# Patient Record
Sex: Female | Born: 1975 | Race: White | Hispanic: No | Marital: Married | Smoking: Current every day smoker
Health system: Southern US, Community
[De-identification: ages and names within clinical notes are randomized; demographics above are authoritative.]

## PROBLEM LIST (undated history)

## (undated) DIAGNOSIS — N200 Calculus of kidney: Secondary | ICD-10-CM

## (undated) DIAGNOSIS — G4700 Insomnia, unspecified: Secondary | ICD-10-CM

## (undated) DIAGNOSIS — N809 Endometriosis, unspecified: Secondary | ICD-10-CM

## (undated) HISTORY — PX: ABDOMINAL HYSTERECTOMY: SHX81

## (undated) HISTORY — PX: ABLATION COLPOCLESIS: SHX1118

## (undated) HISTORY — PX: ABDOMINAL EXPLORATION SURGERY: SHX538

## (undated) HISTORY — PX: TUBAL LIGATION: SHX77

---

## 1997-11-05 ENCOUNTER — Inpatient Hospital Stay (HOSPITAL_COMMUNITY): Admission: AD | Admit: 1997-11-05 | Discharge: 1997-11-05 | Payer: Self-pay | Admitting: *Deleted

## 1997-12-04 ENCOUNTER — Inpatient Hospital Stay (HOSPITAL_COMMUNITY): Admission: AD | Admit: 1997-12-04 | Discharge: 1997-12-06 | Payer: Self-pay | Admitting: Obstetrics and Gynecology

## 2001-11-15 ENCOUNTER — Other Ambulatory Visit: Admission: RE | Admit: 2001-11-15 | Discharge: 2001-11-15 | Payer: Self-pay | Admitting: Obstetrics and Gynecology

## 2002-02-08 ENCOUNTER — Ambulatory Visit (HOSPITAL_COMMUNITY): Admission: RE | Admit: 2002-02-08 | Discharge: 2002-02-08 | Payer: Self-pay | Admitting: Obstetrics and Gynecology

## 2003-06-05 ENCOUNTER — Other Ambulatory Visit: Admission: RE | Admit: 2003-06-05 | Discharge: 2003-06-05 | Payer: Self-pay | Admitting: Obstetrics and Gynecology

## 2004-11-08 ENCOUNTER — Ambulatory Visit: Payer: Self-pay | Admitting: Internal Medicine

## 2004-11-23 ENCOUNTER — Ambulatory Visit: Payer: Self-pay | Admitting: Internal Medicine

## 2004-11-26 ENCOUNTER — Emergency Department (HOSPITAL_COMMUNITY): Admission: EM | Admit: 2004-11-26 | Discharge: 2004-11-26 | Payer: Self-pay | Admitting: Emergency Medicine

## 2004-12-29 ENCOUNTER — Encounter (INDEPENDENT_AMBULATORY_CARE_PROVIDER_SITE_OTHER): Payer: Self-pay | Admitting: Specialist

## 2004-12-29 ENCOUNTER — Ambulatory Visit (HOSPITAL_COMMUNITY): Admission: RE | Admit: 2004-12-29 | Discharge: 2004-12-29 | Payer: Self-pay | Admitting: Obstetrics and Gynecology

## 2006-10-16 ENCOUNTER — Encounter: Admission: RE | Admit: 2006-10-16 | Discharge: 2006-10-16 | Payer: Self-pay | Admitting: Orthopedic Surgery

## 2007-10-02 ENCOUNTER — Encounter: Admission: RE | Admit: 2007-10-02 | Discharge: 2007-10-02 | Payer: Self-pay | Admitting: Internal Medicine

## 2010-03-12 ENCOUNTER — Encounter
Admission: RE | Admit: 2010-03-12 | Discharge: 2010-03-12 | Payer: Self-pay | Admitting: Physical Medicine and Rehabilitation

## 2010-10-23 ENCOUNTER — Encounter: Payer: Self-pay | Admitting: Internal Medicine

## 2010-10-24 ENCOUNTER — Encounter: Payer: Self-pay | Admitting: General Surgery

## 2011-02-18 NOTE — Op Note (Signed)
[facility]  Patient:    [name], [name] Visit Number: [id number] MRN: [id number]          Service Type: DSU Location: [facility] Attending Physician:  Lenoard Aden Dictated by:   Lenoard Aden, M.D. Proc. Date: 02/08/02 Admit Date:  02/08/2002 Discharge Date: 02/08/2002   CC:         [facility]   Operative Report  PREOPERATIVE DIAGNOSES:       Desire for elective sterilization.  POSTOPERATIVE DIAGNOSES:      Desire for elective sterilization.  PROCEDURE:                    Laparoscopic tubal sterilization.  SURGEON:                      Lenoard Aden, M.D.  ANESTHESIA:                   General.  ESTIMATED BLOOD LOSS:         Less than 50 cc.  DRAINS:                       None.  COUNTS:                       Correct.  DISPOSITION:                  Patient to recovery in good condition.  BRIEF OPERATIVE NOTE:         After being apprised of the risks of anesthesia, infection, bleeding, injury to abdominal organs with need for repair, patient was brought to the operating room where she was administered general anesthetic without complications, prepped and draped in the usual sterile fashion, catheterized until the bladder was empty.  After achieving adequate anesthesia examination under anesthesia reveals a small mid positioned uterus and no adnexal masses.  Hulka tenaculum placed per vagina.  Infraumbilical incision then made with a scalpel after placement of a dilute Marcaine solution.  Veress needle placed.  Opening pressure -1 noted.  Hanging drop test is negative revealing negative intraperitoneal pressure.  CO2 4 L insufflated without difficulty setting patient pressure to 25.  Trocar placed atraumatically.  Pictures taken.  CO2 released down to a pressure of 15. Normal liver, gallbladder bed, normal appendix, normal tubes and ovaries, normal uterus, normal anterior/posterior cul-de-sac are all noted.  At this time  Kleppinger bipolar cautery entered through the operative port and cauterization using bipolar cautery down to a resistance of 0 was done on each tube after being traced out to the fimbriated end cauterizing three contiguous areas down to a resistance of 0.  Tubes were divided using scissors bilaterally and tubal lumens are visualized.  CO2 is released.  Visualization reveals no bleeding.  Instruments are removed from the patients abdomen under direct visualization.  Patients incision is closed using 0 Vicryl and Dermabond.  Instruments removed from the vagina.  Patient is awakened and transferred to recovery in good condition. Dictated by:   Lenoard Aden, M.D. Attending Physician:  Lenoard Aden DD:  02/08/02 TD:  02/11/02 Job: [id number]

## 2011-02-18 NOTE — Op Note (Signed)
[name], [name]                ACCOUNT NO.:  [id number]   MEDICAL RECORD NO.:  [id number]          PATIENT TYPE:  AMB   LOCATION:  SDC                           FACILITY:  [facility]   PHYSICIAN:  Lenoard Aden, M.D.DATE OF BIRTH:  1976/01/01   DATE OF PROCEDURE:  12/29/2004  DATE OF DISCHARGE:                                 OPERATIVE REPORT   PREOPERATIVE DIAGNOSES:  1.  Menorrhagia.  2.  Pelvic pain.   POSTOPERATIVE DIAGNOSES:  1.  Menorrhagia.  2.  Pelvic pain.  3.  Pelvic endometriosis.   PROCEDURE:  Diagnostic hysteroscopy, dilatation and curettage, NovaSure  endometrial ablation, diagnostic laparoscopy, ablation of endometriosis.   SURGEON:  Lenoard Aden, M.D.   ANESTHESIA:  General.   ESTIMATED BLOOD LOSS:  Less than 50 mL.   COMPLICATIONS:  None.   DRAINS:  None.   COUNTS:  Correct.   Patient to recovery in good condition.   BRIEF OPERATIVE NOTE:  After being apprised of the risks of anesthesia,  infection, bleeding, inability to cure pelvic pain, injury to abdominal  organs with possible need for repair, uterine perforation with possible need  for repair, delayed versus immediate complications to include bowel and  bladder injury with need for repair, the patient was brought to the  operating room, where she was administered a general anesthetic without  complications.  She was prepped and draped in the usual sterile fashion.  Her feet are placed in the [place] stirrups.  At this time exam under  anesthesia reveals an anteflexed uterus and no adnexal masses.  A Foley  catheter placed.  The patient was prepped and draped as noted in the usual  sterile fashion and the uterus sounds to 7 cm and an intracervical length of  3 cm is noted for a total length of 4 cm.  The cervix is dilated up to a #25  Pratt dilator, hysteroscope placed.  Visualization reveals thickened  endometrium but no obvious structural masses.  D&C performed.  Revisualization  reveals a normal endometrial cavity without evidence of  endometrial masses, normal tubal ostia bilaterally.  NovaSure endometrial  ablation was accomplished.  Intracavitary width set at 2.8 cm.  CO2 test is  normal and ablation is accomplished without difficulty, taking approximately  96 seconds total.  The instrument is removed.  Revisualization  hysteroscopically reveals a well-ablated cavity and no evidence of  perforation.  At this time instruments were removed, Hulka tenaculum placed.  During the hysteroscopic procedure prior to the laparoscopic procedure, a  dilute lidocaine/epinephrine solution 20 mL was placed in a standard  paracervical block.  Infraumbilical incision was then made with a scalpel,  Veress needle placed.  Opening pressure of -1 noted.  Three liters CO2  insufflated.  Both the trocar and laparoscope placed.  Atraumatic trocar  entry is noted.  Normal liver, gallbladder bed.  Appendix is adherent to the  posterior wall of the peritoneum but does not appear inflamed.  At this time  the pelvis is visualized.  There is nothing in the anterior cul-de-sac.  Both tubes are previously divided.  Bilateral normal ovaries are noted.  There is evidence of three peritoneal windows in the posterior cul-de-sac  and an implant of powdery blue endometrial implant consistent with  endometriosis.  Kleppinger bipolar cautery is entered through a 5 mm trocar  site which is placed atraumatically and suprapubically.  This area is  cauterized.  The peritoneal windows are inverted and cauterized using  Kleppinger bipolar cautery.  Good hemostasis is noted.  Ureters are noted to  be peristalsing bilaterally.  At this time no other endometriosis is  identified.  The instruments are removed under direct visualization and the  CO2 is released.  The infraumbilical incision is closed using a 0 Vicryl and  Dermabond, Dermabond placed in the lower incision, dilute Marcaine solution  10 mL total  placed.  All instruments are removed from the vagina.  The  patient is awakened and transferred to recovery in good condition.      RJT/MEDQ  D:  12/29/2004  T:  12/29/2004  Job:  [id number]

## 2011-02-18 NOTE — H&P (Signed)
[facility]  Patient:    HARMONIE, VERRASTRO Visit Number: [id number] MRN: [id number]          Service Type: DSU Location: [facility] Attending Physician:  Lenoard Aden Dictated by:   Lenoard Aden, M.D. Admit Date:  02/08/2002   CC:         [facility]   History and Physical  CHIEF COMPLAINT:              Desire for elective sterilization.  HISTORY OF PRESENT ILLNESS:   The patient is a 35 year old white female, G2, P1, with history of situational depression and desire for elective sterilization who presents for tubal ligation.  PAST MEDICAL HISTORY:         Remarkable for uncomplicated vaginal delivery and one uncomplicated abortion.   Has had no other medical or surgical hospitalizations.  MEDICATIONS:                  Zoloft.  ALLERGIES:                    No known drug allergies.  FAMILY HISTORY:               Remarkable for hypertension, diabetes, colon cancer.  PHYSICAL EXAMINATION:  GENERAL:                      She is a well-developed, well-nourished white female in no apparent distress.  HEENT:                        Normal.  LUNGS:                        Clear.  HEART:                        Regular rate and rhythm.  ABDOMEN:                      Soft and nontender.  PELVIC:                       Uterus mid position.  No adnexal masses.  EXTREMITIES:                  No cords.  NEUROLOGIC:                   Nonfocal.  LABORATORY DATA:              Beta HCG negative.  IMPRESSION:                   1. Desire for elective sterilization.                               2. Situational depression.  PLAN:                         Proceed with laparoscopic tubal sterilization. Risks of anesthesia, infection, bleeding, injury to abdominal organs with need for repair discussed.  The patient acknowledges and desires to proceed. Delayed versus immediate complications to include bowel or bladder injury noted.  Failure risk of  tubal ligation 5 to 10:1000 noted.  Permanence of procedure discussed.  The patient has explored all other contraceptive issues  and wishes to proceed as previously discussed. Dictated by:   Lenoard Aden, M.D. Attending Physician:  Lenoard Aden DD:  02/08/02 TD:  02/08/02 Job: [id number]

## 2011-05-04 DIAGNOSIS — N200 Calculus of kidney: Secondary | ICD-10-CM

## 2011-05-04 HISTORY — DX: Calculus of kidney: N20.0

## 2011-05-05 ENCOUNTER — Emergency Department (INDEPENDENT_AMBULATORY_CARE_PROVIDER_SITE_OTHER): Payer: 59

## 2011-05-05 ENCOUNTER — Encounter: Payer: Self-pay | Admitting: *Deleted

## 2011-05-05 ENCOUNTER — Emergency Department (HOSPITAL_BASED_OUTPATIENT_CLINIC_OR_DEPARTMENT_OTHER)
Admission: EM | Admit: 2011-05-05 | Discharge: 2011-05-05 | Disposition: A | Discharge status: Home / Self Care | Payer: 59 | Attending: Emergency Medicine | Admitting: Emergency Medicine

## 2011-05-05 DIAGNOSIS — N200 Calculus of kidney: Secondary | ICD-10-CM

## 2011-05-05 DIAGNOSIS — F17200 Nicotine dependence, unspecified, uncomplicated: Secondary | ICD-10-CM | POA: Insufficient documentation

## 2011-05-05 DIAGNOSIS — R1011 Right upper quadrant pain: Secondary | ICD-10-CM | POA: Insufficient documentation

## 2011-05-05 LAB — URINALYSIS, ROUTINE W REFLEX MICROSCOPIC
Bilirubin Urine: NEGATIVE
Glucose, UA: NEGATIVE mg/dL
Ketones, ur: NEGATIVE mg/dL
Leukocytes, UA: NEGATIVE
Protein, ur: NEGATIVE mg/dL
pH: 5.5 (ref 5.0–8.0)

## 2011-05-05 LAB — URINE MICROSCOPIC-ADD ON

## 2011-05-05 MED ORDER — HYDROMORPHONE HCL 1 MG/ML IJ SOLN
0.5000 mg | INTRAMUSCULAR | Status: DC | PRN
  Administered 2011-05-05: 0.5 mg via INTRAVENOUS
  Filled 2011-05-05: qty 1

## 2011-05-05 MED ORDER — KETOROLAC TROMETHAMINE 30 MG/ML IJ SOLN
30.0000 mg | Freq: Once | INTRAMUSCULAR | Status: AC
  Administered 2011-05-05: 30 mg via INTRAVENOUS
  Filled 2011-05-05: qty 1

## 2011-05-05 MED ORDER — TAMSULOSIN HCL 0.4 MG PO CAPS: 0.4000 mg | ORAL_CAPSULE | Freq: Every day | ORAL | Status: DC

## 2011-05-05 MED ORDER — ONDANSETRON HCL 4 MG/2ML IJ SOLN
4.0000 mg | Freq: Three times a day (TID) | INTRAMUSCULAR | Status: DC | PRN
Start: 2011-05-05 — End: 2011-05-05
  Administered 2011-05-05: 4 mg via INTRAVENOUS
  Filled 2011-05-05: qty 2

## 2011-05-05 MED ORDER — OXYCODONE-ACETAMINOPHEN 5-325 MG PO TABS: 2.0000 | ORAL_TABLET | ORAL | Status: AC | PRN

## 2011-05-05 NOTE — ED Provider Notes (Signed)
History     CSN: [id number] Arrival date & time: 05/05/2011  1:40 PM  Chief Complaint  Patient presents with  . Abdominal Pain   Patient is a 35 y.o. female presenting with abdominal pain. The history is provided by the patient.  Abdominal Pain The primary symptoms of the illness include abdominal pain and nausea. The current episode started 1 to 2 hours ago. The onset of the illness was sudden. The problem has been gradually improving.  The abdominal pain began less than 1 hour ago. The pain came on suddenly. The abdominal pain is located in the RUQ and RLQ. The abdominal pain radiates to the RUQ and RLQ. The abdominal pain is relieved by nothing. The abdominal pain is exacerbated by deep breathing and certain positions.  The patient states that she believes she is currently not pregnant. The patient has not had a change in bowel habit. Additional symptoms associated with the illness include hematuria, frequency and back pain. Symptoms associated with the illness do not include chills or constipation. Significant associated medical issues do not include inflammatory bowel disease.    History reviewed. No pertinent past medical history.  Past Surgical History  Procedure Date  . Ablation colpoclesis   . Tubal ligation     No family history on file.  History  Substance Use Topics  . Smoking status: Current Everyday Smoker -- 0.5 packs/day  . Smokeless tobacco: Not on file  . Alcohol Use: Yes     occasionally    OB History    Grav Para Term Preterm Abortions TAB SAB Ect Mult Living                  Review of Systems  Constitutional: Negative for chills.  Gastrointestinal: Positive for nausea and abdominal pain. Negative for constipation.  Genitourinary: Positive for frequency and hematuria.  Musculoskeletal: Positive for back pain.  All other systems reviewed and are negative.    Physical Exam  BP 135/88  Pulse 97  Temp 97.9 F (36.6 C)  Resp 22  SpO2 100%  LMP  12/01/2004  Physical Exam  Constitutional: She appears well-developed and well-nourished.  Eyes: Pupils are equal, round, and reactive to light.  Neck: Normal range of motion.  Cardiovascular: Normal rate.   Pulmonary/Chest: Effort normal.  Abdominal: Soft. She exhibits no mass. There is no tenderness. There is no rebound and no guarding.  Musculoskeletal: Normal range of motion.  Skin: Skin is warm and dry.  Psychiatric: She has a normal mood and affect.    ED Course  Procedures  MDM  Urine shows a trace of hemoglobin. There is a 2 mm calculus within the right UVJ.   Pt counseled on results,  Referred to Urology,  Pt given Rx for pain medication and flomax.       Langston Masker, [place] 05/05/11 1551  Langston Masker, [place] 05/05/11 [phone]

## 2011-05-05 NOTE — ED Provider Notes (Signed)
Medical screening examination/treatment/procedure(s) were performed by non-physician practitioner and as supervising physician I was immediately available for consultation/collaboration.  Celene Kras, MD 05/05/11 2220

## 2011-05-05 NOTE — ED Notes (Signed)
Sudden onset left lower quad pain.  

## 2011-05-09 ENCOUNTER — Encounter (HOSPITAL_BASED_OUTPATIENT_CLINIC_OR_DEPARTMENT_OTHER): Payer: Self-pay | Admitting: *Deleted

## 2011-05-09 ENCOUNTER — Emergency Department (HOSPITAL_BASED_OUTPATIENT_CLINIC_OR_DEPARTMENT_OTHER)
Admission: EM | Admit: 2011-05-09 | Discharge: 2011-05-10 | Disposition: A | Discharge status: Home / Self Care | Payer: 59 | Attending: Emergency Medicine | Admitting: Emergency Medicine

## 2011-05-09 DIAGNOSIS — Z79899 Other long term (current) drug therapy: Secondary | ICD-10-CM | POA: Insufficient documentation

## 2011-05-09 DIAGNOSIS — N83209 Unspecified ovarian cyst, unspecified side: Secondary | ICD-10-CM | POA: Insufficient documentation

## 2011-05-09 DIAGNOSIS — R109 Unspecified abdominal pain: Secondary | ICD-10-CM | POA: Insufficient documentation

## 2011-05-09 DIAGNOSIS — N200 Calculus of kidney: Secondary | ICD-10-CM | POA: Insufficient documentation

## 2011-05-09 LAB — DIFFERENTIAL
Eosinophils Absolute: 0.1 10*3/uL (ref 0.0–0.7)
Eosinophils Relative: 1 % (ref 0–5)
Lymphocytes Relative: 35 % (ref 12–46)
Lymphs Abs: 2.5 10*3/uL (ref 0.7–4.0)
Monocytes Relative: 7 % (ref 3–12)

## 2011-05-09 LAB — BASIC METABOLIC PANEL
BUN: 10 mg/dL (ref 6–23)
CO2: 24 mEq/L (ref 19–32)
Chloride: 100 mEq/L (ref 96–112)
Glucose, Bld: 89 mg/dL (ref 70–99)
Sodium: 137 mEq/L (ref 135–145)

## 2011-05-09 LAB — URINALYSIS, ROUTINE W REFLEX MICROSCOPIC
Glucose, UA: NEGATIVE mg/dL
Hgb urine dipstick: NEGATIVE
Ketones, ur: NEGATIVE mg/dL
Leukocytes, UA: NEGATIVE
Protein, ur: NEGATIVE mg/dL
Specific Gravity, Urine: 1.004 — ABNORMAL LOW (ref 1.005–1.030)
pH: 6 (ref 5.0–8.0)

## 2011-05-09 LAB — CBC
Hemoglobin: 13.4 g/dL (ref 12.0–15.0)
MCHC: 34.6 g/dL (ref 30.0–36.0)
Platelets: 217 10*3/uL (ref 150–400)
RDW: 12.1 % (ref 11.5–15.5)
WBC: 7.4 10*3/uL (ref 4.0–10.5)

## 2011-05-09 MED ORDER — HYDROMORPHONE HCL 1 MG/ML IJ SOLN
1.0000 mg | Freq: Once | INTRAMUSCULAR | Status: AC
  Administered 2011-05-09: 1 mg via INTRAVENOUS
  Filled 2011-05-09: qty 1

## 2011-05-09 MED ORDER — KETOROLAC TROMETHAMINE 30 MG/ML IJ SOLN
30.0000 mg | Freq: Once | INTRAMUSCULAR | Status: AC
  Administered 2011-05-09: 30 mg via INTRAVENOUS
  Filled 2011-05-09: qty 1

## 2011-05-09 MED ORDER — SODIUM CHLORIDE 0.9 % IV BOLUS (SEPSIS)
1000.0000 mL | Freq: Once | INTRAVENOUS | Status: AC
  Administered 2011-05-09: 1000 mL via INTRAVENOUS

## 2011-05-09 MED ORDER — ONDANSETRON HCL 4 MG/2ML IJ SOLN
4.0000 mg | Freq: Once | INTRAMUSCULAR | Status: AC
  Administered 2011-05-09: 4 mg via INTRAVENOUS
  Filled 2011-05-09: qty 2

## 2011-05-09 NOTE — ED Provider Notes (Signed)
History    Scribed for Forbes Cellar, MD, the patient was seen in room MH12/MH12. This chart was scribed by Clarita Crane. This patient's care was started at 8:57PM.  CSN: [id number] Arrival date & time: 05/09/2011  7:27 PM  Chief Complaint  Patient presents with  . Abdominal Pain   HPI  Patient is a 35 year old female c/o waxing and waning sharp left sided abdominal pain with associated dysuria and nausea onset 4 days ago but significantly worsened several hours ago while at work. Denies hematuria, fever, chills, vomiting, constipation and diarrhea, vaginal discharge, chest pain, SOB. States abdominal pain is aggravated with lying flat and relieved by nothing. Notes she experienced no relief from Ibuprofen. Patient reports she had a CT-Abdomen performed 4 days ago which revealed a right sided kidney stone. States pain is identical to that pain but more on left side. Patient also notes she was discharged home after evaluation in ED 4 days ago with prescription for Oxycodone but ran out of the medication 2 days ago. Patient reports surgical history of tubal ligation and ablation colpoclesis. Patient is a current everyday smoker, occasional drinker and denies drug abuse. She does have a remote h/o ovarian cyst. Denies blood in stool   PAST MEDICAL HISTORY:  History reviewed. No pertinent past medical history.  PAST SURGICAL HISTORY:  Past Surgical History  Procedure Date  . Ablation colpoclesis   . Tubal ligation     MEDICATIONS:  Previous Medications   ALPRAZOLAM (XANAX) 0.5 MG TABLET    Take 0.5 mg by mouth daily. Anxiety and panic attacks     DIPHENHYDRAMINE-ACETAMINOPHEN (TYLENOL PM) 25-500 MG TABS    Take 2 tablets by mouth at bedtime.    IBUPROFEN (ADVIL,MOTRIN) 200 MG TABLET    Take 400 mg by mouth as needed. pain   OXYCODONE-ACETAMINOPHEN (PERCOCET) 5-325 MG PER TABLET    Take 2 tablets by mouth every 4 (four) hours as needed for pain.   PEDIATRIC MULTIVIT-MINERALS-C (FLINTSTONES  GUMMIES PO)    Take 1 tablet by mouth daily.     TAMSULOSIN HCL (FLOMAX) 0.4 MG CAPS    Take 1 capsule (0.4 mg total) by mouth daily.     ALLERGIES:  Allergies as of 05/09/2011  . (No Known Allergies)     FAMILY HISTORY:  No family history on file.   SOCIAL HISTORY: History   Social History  . Marital Status: Married    Spouse Name: N/A    Number of Children: N/A  . Years of Education: N/A   Social History Main Topics  . Smoking status: Current Everyday Smoker -- 0.5 packs/day  . Smokeless tobacco: None  . Alcohol Use: Yes     occasionally  . Drug Use: No  . Sexually Active: No   Other Topics Concern  . None   Social History Narrative  . None     Review of Systems 10 Systems reviewed and are negative for acute change except as noted in the HPI.  Physical Exam  BP 148/85  Pulse 88  Temp(Src) 98.7 F (37.1 C) (Oral)  Resp 12  SpO2 100%  LMP 12/01/2004  Physical Exam  Nursing note and vitals reviewed. Constitutional: She is oriented to person, place, and time. She appears well-developed and well-nourished.       Uncomfortable appearing.   HENT:  Head: Normocephalic and atraumatic.  Eyes: Conjunctivae are normal. Pupils are equal, round, and reactive to light.  Neck: Neck supple.  Cardiovascular: Normal rate and  regular rhythm.  Exam reveals no gallop and no friction rub.   No murmur heard. Pulmonary/Chest: Effort normal and breath sounds normal. She has no wheezes. She has no rales.  Abdominal: Soft. Bowel sounds are normal. She exhibits no distension. There is no tenderness. There is no CVA tenderness.  Genitourinary: No vaginal discharge found.       No CMT, no r/l adnexal ttp  Musculoskeletal: Normal range of motion. She exhibits no edema.  Neurological: She is alert and oriented to person, place, and time. No sensory deficit.  Skin: Skin is warm and dry.  Psychiatric: She has a normal mood and affect. Her behavior is normal.    ED Course    Procedures  OTHER DATA REVIEWED: Nursing notes, vital signs, and past medical records reviewed. Previous medical records reviewed and considered  Ct Abdomen Pelvis Wo Contrast  05/05/2011  *RADIOLOGY REPORT*  Clinical Data: Set left lower quadrant flank pain.  CT ABDOMEN AND PELVIS WITHOUT CONTRAST  Technique:  Multidetector CT imaging of the abdomen and pelvis was performed following the standard protocol without intravenous contrast.  Comparison: CT abdomen 11/26/2004  Findings: Lung bases are clear.  No focal hepatic lesion was noncontrast exam.  The gallbladder, pancreas, spleen, adrenal glands normal.  There is a 1 mm calculus within the mid right kidney (image 32). No hydronephrosis.  No hydroureter.  There is a 2 mm calculus within the right vesicoureteral junction.  This is on the vesicular side of the junction may be completely within the bladder.  No evidence of left nephrolithiasis or ureterolithiasis.  The stomach, small bowel, appendix, and cecum are normal.  The colon rectosigmoid colon are normal. Uterus and ovaries are normal. No free fluid the pelvis.  IMPRESSION:  1.  Small calculus within the right vesicoureteral junction.  The stone is on the bladder side of the junction. 2.  Punctate calcification within the right kidney. 3.  Normal appendix.  Original Report Authenticated By: Genevive Bi, M.D.   DIAGNOSTIC STUDIES:   LABS / RADIOLOGY: Results for orders placed during the hospital encounter of 05/09/11  URINALYSIS, ROUTINE W REFLEX MICROSCOPIC      Component Value Range   Color, Urine YELLOW  YELLOW    Appearance CLEAR  CLEAR    Specific Gravity, Urine 1.004 (*) 1.005 - 1.030    pH 6.0  5.0 - 8.0    Glucose, UA NEGATIVE  NEGATIVE (mg/dL)   Hgb urine dipstick NEGATIVE  NEGATIVE    Bilirubin Urine NEGATIVE  NEGATIVE    Ketones, ur NEGATIVE  NEGATIVE (mg/dL)   Protein, ur NEGATIVE  NEGATIVE (mg/dL)   Urobilinogen, UA 0.2  0.0 - 1.0 (mg/dL)   Nitrite NEGATIVE  NEGATIVE     Leukocytes, UA NEGATIVE  NEGATIVE   CBC      Component Value Range   WBC 7.4  4.0 - 10.5 (K/uL)   RBC 4.09  3.87 - 5.11 (MIL/uL)   Hemoglobin 13.4  12.0 - 15.0 (g/dL)   HCT 40.9  81.1 - 91.4 (%)   MCV 94.6  78.0 - 100.0 (fL)   MCH 32.8  26.0 - 34.0 (pg)   MCHC 34.6  30.0 - 36.0 (g/dL)   RDW 78.2  95.6 - 21.3 (%)   Platelets 217  150 - 400 (K/uL)  DIFFERENTIAL      Component Value Range   Neutrophils Relative 57  43 - 77 (%)   Neutro Abs 4.2  1.7 - 7.7 (K/uL)   Lymphocytes  Relative 35  12 - 46 (%)   Lymphs Abs 2.5  0.7 - 4.0 (K/uL)   Monocytes Relative 7  3 - 12 (%)   Monocytes Absolute 0.5  0.1 - 1.0 (K/uL)   Eosinophils Relative 1  0 - 5 (%)   Eosinophils Absolute 0.1  0.0 - 0.7 (K/uL)   Basophils Relative 0  0 - 1 (%)   Basophils Absolute 0.0  0.0 - 0.1 (K/uL)  BASIC METABOLIC PANEL      Component Value Range   Sodium 137  135 - 145 (mEq/L)   Potassium 3.9  3.5 - 5.1 (mEq/L)   Chloride 100  96 - 112 (mEq/L)   CO2 24  19 - 32 (mEq/L)   Glucose, Bld 89  70 - 99 (mg/dL)   BUN 10  6 - 23 (mg/dL)   Creatinine, Ser 1.61  0.50 - 1.10 (mg/dL)   Calcium 09.6  8.4 - 10.5 (mg/dL)   GFR calc non Af Amer >60  >60 (mL/min)   GFR calc Af Amer >60  >60 (mL/min)    PROCEDURES:  ED COURSE / COORDINATION OF CARE: 12:17 AM  Pt denies pain at this time. She does have min LLQ ttp on exam at this time. States she is ready to go home. Will call her OB for f/u tomorrow as well as urology. Extensive discussion with patient regarding her pain being atypical for renal stones alone especially given that her renal stones were on the right side. However, I do not feel that this is ovarian torsion and she can have an u/s of ovaries to evaluate for cyst as an outpatient, which i will order.  Pt verbalized understanding. Given precautions for return   MDM: Differential Diagnosis: renal stone, ovarian cyst, less likely ovarian torsion/diverticulitis/colitis  PLAN: Discharge The patient is to  return the emergency department if there is any worsening of symptoms. I have reviewed the discharge instructions with the patient/family   CONDITION ON DISCHARGE: Stable, improved   MEDICATIONS GIVEN IN THE E.D.  Medications  sodium chloride 0.9 % bolus 1,000 mL (1000 mL Intravenous Given 05/09/11 2200)  ketorolac (TORADOL) injection 30 mg (30 mg Intravenous Given 05/09/11 2159)  ondansetron (ZOFRAN) injection 4 mg (4 mg Intravenous Given 05/09/11 2159)  HYDROmorphone (DILAUDID) injection 1 mg (1 mg Intravenous Given 05/09/11 2346)     I personally performed the services described in this documentation, which was scribed in my presence. The recorded information has been reviewed and considered. Forbes Cellar, MD        Forbes Cellar, MD 05/10/11 [phone]

## 2011-05-09 NOTE — ED Notes (Signed)
Kidney stone last week. Pain returned while at work today.

## 2011-05-09 NOTE — ED Notes (Signed)
Encouraged pt. To relax and explained the reason for the wait.  No vomiting or diarrhea noted by Pt.  Pt. Has husband at bedside.

## 2011-05-10 ENCOUNTER — Inpatient Hospital Stay (HOSPITAL_BASED_OUTPATIENT_CLINIC_OR_DEPARTMENT_OTHER): Admit: 2011-05-10 | Payer: 59

## 2011-05-10 ENCOUNTER — Other Ambulatory Visit (HOSPITAL_BASED_OUTPATIENT_CLINIC_OR_DEPARTMENT_OTHER): Payer: 59

## 2011-05-10 ENCOUNTER — Other Ambulatory Visit (HOSPITAL_BASED_OUTPATIENT_CLINIC_OR_DEPARTMENT_OTHER): Payer: Self-pay | Admitting: Emergency Medicine

## 2011-05-10 DIAGNOSIS — N83209 Unspecified ovarian cyst, unspecified side: Secondary | ICD-10-CM

## 2011-05-10 MED ORDER — HYDROCODONE-ACETAMINOPHEN 7.5-500 MG PO TABS: 1.0000 | ORAL_TABLET | Freq: Four times a day (QID) | ORAL | Status: AC | PRN

## 2011-05-10 MED ORDER — IBUPROFEN 600 MG PO TABS: 600.0000 mg | ORAL_TABLET | Freq: Four times a day (QID) | ORAL | Status: AC | PRN

## 2011-05-10 MED ORDER — HYDROCODONE-ACETAMINOPHEN 5-325 MG PO TABS
1.0000 | ORAL_TABLET | Freq: Once | ORAL | Status: AC
  Administered 2011-05-10: 1 via ORAL
  Filled 2011-05-10: qty 1

## 2011-07-10 ENCOUNTER — Inpatient Hospital Stay (HOSPITAL_COMMUNITY): Payer: 59

## 2011-07-10 ENCOUNTER — Encounter (HOSPITAL_COMMUNITY): Payer: Self-pay | Admitting: *Deleted

## 2011-07-10 ENCOUNTER — Inpatient Hospital Stay (HOSPITAL_COMMUNITY)
Admission: AD | Admit: 2011-07-10 | Discharge: 2011-07-10 | Disposition: A | Discharge status: Home / Self Care | Payer: 59 | Source: Ambulatory Visit | Attending: Obstetrics & Gynecology | Admitting: Obstetrics & Gynecology

## 2011-07-10 DIAGNOSIS — N809 Endometriosis, unspecified: Secondary | ICD-10-CM | POA: Insufficient documentation

## 2011-07-10 DIAGNOSIS — N949 Unspecified condition associated with female genital organs and menstrual cycle: Secondary | ICD-10-CM | POA: Insufficient documentation

## 2011-07-10 HISTORY — DX: Calculus of kidney: N20.0

## 2011-07-10 LAB — DIFFERENTIAL
Eosinophils Absolute: 0.1 10*3/uL (ref 0.0–0.7)
Lymphs Abs: 2.2 10*3/uL (ref 0.7–4.0)
Monocytes Relative: 7 % (ref 3–12)
Neutrophils Relative %: 61 % (ref 43–77)

## 2011-07-10 LAB — URINALYSIS, ROUTINE W REFLEX MICROSCOPIC
Bilirubin Urine: NEGATIVE
Glucose, UA: NEGATIVE mg/dL
Hgb urine dipstick: NEGATIVE
Nitrite: NEGATIVE
Specific Gravity, Urine: <1.005 — ABNORMAL LOW (ref 1.005–1.030)
pH: 6 (ref 5.0–8.0)

## 2011-07-10 LAB — CBC
HCT: 38.7 % (ref 36.0–46.0)
Hemoglobin: 13 g/dL (ref 12.0–15.0)
MCH: 32.5 pg (ref 26.0–34.0)
MCV: 96.8 fL (ref 78.0–100.0)
RBC: 4 MIL/uL (ref 3.87–5.11)

## 2011-07-10 MED ORDER — HYDROMORPHONE HCL 2 MG PO TABS
2.0000 mg | ORAL_TABLET | Freq: Once | ORAL | Status: AC
  Administered 2011-07-10: 2 mg via ORAL
  Filled 2011-07-10: qty 1

## 2011-07-10 MED ORDER — HYDROMORPHONE HCL 2 MG PO TABS: 2.0000 mg | ORAL_TABLET | ORAL | Status: DC | PRN

## 2011-07-10 NOTE — Progress Notes (Signed)
Onset of pain August 2nd was told has endometriosis, taking Ibuprofen 800 mg, Percocet, and Xanax was told on Friday if worsens to come to [facility]

## 2011-07-10 NOTE — ED Provider Notes (Signed)
[name] is an 35 y.o. female G3P1011  RP:  Severe pelvic pain, known endometriosis           HPI:  Severe pelvic pain, not controled with Percocet/Ibuprofen anymore.  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 ROS neg, BMs wnl, no UTI Sx. Pertinent Gynecological History: Menses: Absent post endometrial ablation. Contraception: abstinence currently and BT/S Blood transfusions: none Sexually transmitted diseases: no past history Previous GYN Procedures: Endometrial ablation  Last mammogram: N/A Last pap:H/O abnormal: LGSIL OB History: G2P1011   Menstrual History: No LMP recorded. Patient has had an ablation.    Past Medical History  Diagnosis Date  . Kidney calculi  August 2012    Past Surgical History  Procedure Date  . Ablation colpoclesis   . Tubal ligation   . Abdominal exploration surgery     No family history on file.  Social History:  reports that she has been smoking.  She does not have any smokeless tobacco history on file. She reports that she drinks alcohol. She reports that she does not use illicit drugs.  Allergies: No Known Allergies  Prescriptions prior to admission  Medication Sig Dispense Refill  . ALPRAZolam (XANAX) 0.5 MG tablet Take 0.5 mg by mouth daily. Anxiety and panic attacks        . ibuprofen (ADVIL,MOTRIN) 200 MG tablet Take 400 mg by mouth as needed. pain      . oxyCODONE-acetaminophen (PERCOCET) 5-325 MG per tablet Take 1 tablet by mouth every 4 (four) hours as needed.        . diphenhydramine-acetaminophen (TYLENOL PM) 25-500 MG TABS Take 2 tablets by mouth at bedtime.       . Pediatric Multivit-Minerals-C (FLINTSTONES GUMMIES PO) Take 1 tablet by mouth daily.        . Tamsulosin HCl (FLOMAX) 0.4 MG CAPS Take 1 capsule (0.4 mg total) by mouth daily.  10 capsule  0    Blood pressure 131/63, pulse 114, temperature 98.8 F (37.1 C), temperature source Oral, resp.  rate 18, height 5\' 1"  (1.549 m), weight 52.617 kg (116 lb).  Abdo:  Soft, no distension.            Tender lower abdo L>R VE:  Cervix tender ++         No mass felt   Results for orders placed during the hospital encounter of 07/10/11 (from the past 24 hour(s))  URINALYSIS, ROUTINE W REFLEX MICROSCOPIC     Status: Abnormal   Collection Time   07/10/11  2:10 PM      Component Value Range   Color, Urine YELLOW  YELLOW    Appearance CLEAR  CLEAR    Specific Gravity, Urine <1.005 (*) 1.005 - 1.030    pH 6.0  5.0 - 8.0    Glucose, UA NEGATIVE  NEGATIVE (mg/dL)   Hgb urine dipstick NEGATIVE  NEGATIVE    Bilirubin Urine NEGATIVE  NEGATIVE    Ketones, ur NEGATIVE  NEGATIVE (mg/dL)   Protein, ur NEGATIVE  NEGATIVE (mg/dL)   Urobilinogen, UA 0.2  0.0 - 1.0 (mg/dL)   Nitrite NEGATIVE  NEGATIVE    Leukocytes, UA NEGATIVE  NEGATIVE   CBC     Status: Normal   Collection Time   07/10/11  2:47 PM      Component Value Range   WBC 7.3  4.0 - 10.5 (K/uL)   RBC 4.00  3.87 - 5.11 (  MIL/uL)   Hemoglobin 13.0  12.0 - 15.0 (g/dL)   HCT 81.1  91.4 - 78.2 (%)   MCV 96.8  78.0 - 100.0 (fL)   MCH 32.5  26.0 - 34.0 (pg)   MCHC 33.6  30.0 - 36.0 (g/dL)   RDW 95.6  21.3 - 08.6 (%)   Platelets 260  150 - 400 (K/uL)  DIFFERENTIAL     Status: Normal   Collection Time   07/10/11  2:47 PM      Component Value Range   Neutrophils Relative 61  43 - 77 (%)   Neutro Abs 4.5  1.7 - 7.7 (K/uL)   Lymphocytes Relative 31  12 - 46 (%)   Lymphs Abs 2.2  0.7 - 4.0 (K/uL)   Monocytes Relative 7  3 - 12 (%)   Monocytes Absolute 0.5  0.1 - 1.0 (K/uL)   Eosinophils Relative 1  0 - 5 (%)   Eosinophils Absolute 0.1  0.0 - 0.7 (K/uL)   Basophils Relative 1  0 - 1 (%)   Basophils Absolute 0.0  0.0 - 0.1 (K/uL)    US Transvaginal Non-ob  07/10/2011  *RADIOLOGY REPORT*  Clinical Data: Pelvic pain  TRANSABDOMINAL AND TRANSVAGINAL ULTRASOUND OF PELVIS  Technique:  Both transabdominal and transvaginal ultrasound examinations  of the pelvis were performed.  Transabdominal technique was performed for global imaging of the pelvis including uterus, ovaries, adnexal regions, and pelvic cul-de-sac.  It was necessary to proceed with endovaginal exam following the transabdominal exam to visualize the uterus, endometrium and ovaries.  Comparison:  05/05/2011  Findings: Uterus:  The uterus measures 5.2 x 2.7 x 3.8 cm.  Normal in appearance.  No mass  Endometrium: Measures 5.9 mm.  Several calcifications are noted.  Right ovary: The right ovary appears normal measuring 3.4 x 2.4 x 2.7 cm.  Normal in appearance.  No mass.  Left ovary: The left ovary measures 2.5 x 1.7 x 2.1 cm.  Normal in appearance.  No mass.  Other Findings:  No free fluid.  IMPRESSION: Normal study.  No evidence of pelvic mass or other significant abnormality.  Original Report Authenticated By: Rosealee Albee, M.D.   US Pelvis Complete  07/10/2011  *RADIOLOGY REPORT*  Clinical Data: Pelvic pain  TRANSABDOMINAL AND TRANSVAGINAL ULTRASOUND OF PELVIS  Technique:  Both transabdominal and transvaginal ultrasound examinations of the pelvis were performed.  Transabdominal technique was performed for global imaging of the pelvis including uterus, ovaries, adnexal regions, and pelvic cul-de-sac.  It was necessary to proceed with endovaginal exam following the transabdominal exam to visualize the uterus, endometrium and ovaries.  Comparison:  05/05/2011  Findings: Uterus:  The uterus measures 5.2 x 2.7 x 3.8 cm.  Normal in appearance.  No mass  Endometrium: Measures 5.9 mm.  Several calcifications are noted.  Right ovary: The right ovary appears normal measuring 3.4 x 2.4 x 2.7 cm.  Normal in appearance.  No mass.  Left ovary: The left ovary measures 2.5 x 1.7 x 2.1 cm.  Normal in appearance.  No mass.  Other Findings:  No free fluid.  IMPRESSION: Normal study.  No evidence of pelvic mass or other significant abnormality.  Original Report Authenticated By: Rosealee Albee, M.D.    MAU course: Pain relatively controled by Dilaudid 2 mg Po x 1 dose  Assessment/Plan:  Severe pelvic pain with known endometriosis.  Declines STD screen.  Pelvic US neg.  CBC wnl, WBC wnl.  Urine neg.                                 Will Rx Dilaudid 2 mg PO q4 hrs and advance surgery ASAP.  Informing Dr Billy Coast.                                 OOW tomorrow.   Aadyn Buchheit,MARIE-LYNE 07/10/2011, 4:01 PM

## 2011-07-10 NOTE — ED Notes (Signed)
Dr. Seymour Bars at beside for exam. Pt very tender upon internal bimanual exam.

## 2011-07-13 ENCOUNTER — Other Ambulatory Visit: Payer: Self-pay | Admitting: Obstetrics and Gynecology

## 2011-07-25 ENCOUNTER — Encounter (HOSPITAL_COMMUNITY)
Admission: RE | Admit: 2011-07-25 | Discharge: 2011-07-25 | Disposition: A | Discharge status: Home / Self Care | Payer: 59 | Source: Ambulatory Visit | Attending: Obstetrics and Gynecology | Admitting: Obstetrics and Gynecology

## 2011-07-25 ENCOUNTER — Encounter (HOSPITAL_COMMUNITY): Payer: Self-pay

## 2011-07-25 HISTORY — DX: Insomnia, unspecified: G47.00

## 2011-07-25 LAB — CBC
HCT: 39.1 % (ref 36.0–46.0)
Hemoglobin: 13.1 g/dL (ref 12.0–15.0)
MCHC: 33.5 g/dL (ref 30.0–36.0)

## 2011-07-25 LAB — SURGICAL PCR SCREEN: Staphylococcus aureus: NEGATIVE

## 2011-07-25 NOTE — Patient Instructions (Signed)
   Your procedure is scheduled on: Fri 07/29/11  Enter through the Main Entrance of [facility] at:1130 am  Pick up the phone at the desk and dial [phone] and inform us of your arrival.  Please call this number if you have any problems the morning of surgery: [phone]  Remember: Do not eat food after midnight:Thurs Do not drink clear liquids after: 9am Fri Take these medicines the morning of surgery with a SIP OF WATER:  Do not wear jewelry, make-up, or FINGER nail polish Do not wear lotions, powders, or perfumes.  You may wear deodorant. Do not shave 48 hours prior to surgery. Do not bring valuables to the hospital.  Leave suitcase in the car. After Surgery it may be brought to your room. For patients being admitted to the hospital, checkout time is 11:00am the day of discharge.  Patients discharged on the day of surgery will not be allowed to drive home.   Name and phone number of your driver: Thayer Ohm- [phone]  Remember to use your hibiclens as instructed.Please shower with 1/2 bottle the evening before your surgery and the other 1/2 bottle the morning of surgery.

## 2011-07-28 NOTE — H&P (Signed)
[name], [name]                ACCOUNT NO.:  [id number]  MEDICAL RECORD NO.:  [id number]  LOCATION:  PERIO                         FACILITY:  [facility]  PHYSICIAN:  Lenoard Aden, M.D.DATE OF BIRTH:  September 04, 1976  DATE OF ADMISSION:  07/11/2011 DATE OF DISCHARGE:                             HISTORY & PHYSICAL   CHIEF COMPLAINT:  Pelvic pain, dysmenorrhea.  HISTORY OF PRESENT ILLNESS:  This 35 year old white female, G2, P1, history of tubal ligation, history of NovaSure endometrial ablation, history of endometriosis with laparoscopic ablation done in 2006 who presents now for definitive therapy of persistent pelvic pain and dysmenorrhea.  She has allergies to TETANUS TOXOID.  Medications include Percocet p.r.n., Xanax p.r.n., and ibuprofen.  She is a nondrinker.  She is a less than a pack a day smoker.  She denies domestic or physical violence.  Surgical history remarkable for hysteroscopy with NovaSure in 2008 and tubal ligation in 2003.  She has a family history of diabetes, colon cancer, and heart disease.  PHYSICAL EXAM:  GENERAL:  She is a well-developed, well-nourished white female, in no acute distress. HEENT:  Normal. NECK:  Supple.  Full range of motion. LUNGS:  Clear. HEART:  Regular rhythm. ABDOMEN:  Soft, nontender. PELVIC:  Exam revealed an anteflexed uterus.  No adnexal masses. EXTREMITIES:  There are no cords. NEUROLOGIC:  Nonfocal. SKIN:  Intact.  IMPRESSION:  Persistent dysmenorrhea and menorrhagia status post laparoscopic ablation of endometriosis, status post NovaSure ablation, status post tubal ligation. LLQ pain  PLAN:  Proceed with da Vinci assisted laparoscopic hysterectomy, LSO with possible ablation of endometriosis.  Risks of anesthesia, infection, bleeding, injury to abdominal organs, need for repair discussed, delayed versus immediate complications to include bowel and bladder injury noted.  Please note that inability to cure pelvic pain  discussed as the result of the procedure.     Lenoard Aden, M.D.     RJT/MEDQ  D:  07/28/2011  T:  07/28/2011  Job:  [id number]

## 2011-07-29 ENCOUNTER — Encounter (HOSPITAL_COMMUNITY): Payer: Self-pay | Admitting: Anesthesiology

## 2011-07-29 ENCOUNTER — Ambulatory Visit (HOSPITAL_COMMUNITY)
Admission: RE | Admit: 2011-07-29 | Discharge: 2011-07-30 | Disposition: A | Discharge status: Home / Self Care | Payer: 59 | Source: Ambulatory Visit | Attending: Obstetrics and Gynecology | Admitting: Obstetrics and Gynecology

## 2011-07-29 ENCOUNTER — Ambulatory Visit (HOSPITAL_COMMUNITY): Payer: 59 | Admitting: Anesthesiology

## 2011-07-29 ENCOUNTER — Encounter (HOSPITAL_COMMUNITY)
Admission: RE | Disposition: A | Discharge status: Home / Self Care | Payer: Self-pay | Source: Ambulatory Visit | Attending: Obstetrics and Gynecology

## 2011-07-29 ENCOUNTER — Other Ambulatory Visit: Payer: Self-pay | Admitting: Obstetrics and Gynecology

## 2011-07-29 DIAGNOSIS — N80109 Endometriosis of ovary, unspecified side, unspecified depth: Secondary | ICD-10-CM | POA: Insufficient documentation

## 2011-07-29 DIAGNOSIS — N803 Endometriosis of pelvic peritoneum, unspecified: Secondary | ICD-10-CM | POA: Insufficient documentation

## 2011-07-29 DIAGNOSIS — Z01818 Encounter for other preprocedural examination: Secondary | ICD-10-CM | POA: Insufficient documentation

## 2011-07-29 DIAGNOSIS — R1032 Left lower quadrant pain: Secondary | ICD-10-CM | POA: Insufficient documentation

## 2011-07-29 DIAGNOSIS — N920 Excessive and frequent menstruation with regular cycle: Secondary | ICD-10-CM | POA: Insufficient documentation

## 2011-07-29 DIAGNOSIS — N801 Endometriosis of ovary: Secondary | ICD-10-CM | POA: Insufficient documentation

## 2011-07-29 DIAGNOSIS — N946 Dysmenorrhea, unspecified: Secondary | ICD-10-CM | POA: Insufficient documentation

## 2011-07-29 DIAGNOSIS — Z01812 Encounter for preprocedural laboratory examination: Secondary | ICD-10-CM | POA: Insufficient documentation

## 2011-07-29 SURGERY — ROBOTIC ASSISTED TOTAL HYSTERECTOMY WITH BILATERAL SALPINGO OOPHORECTOMY
Anesthesia: General | Laterality: Left | Wound class: Clean Contaminated

## 2011-07-29 MED ORDER — ZOLPIDEM TARTRATE 5 MG PO TABS: 5.0000 mg | ORAL_TABLET | Freq: Every evening | ORAL | Status: DC | PRN

## 2011-07-29 MED ORDER — NALOXONE HCL 0.4 MG/ML IJ SOLN: 0.4000 mg | INTRAMUSCULAR | Status: DC | PRN

## 2011-07-29 MED ORDER — LIDOCAINE HCL (CARDIAC) 20 MG/ML IV SOLN
INTRAVENOUS | Status: DC | PRN
  Administered 2011-07-29: 40 mg via INTRAVENOUS

## 2011-07-29 MED ORDER — KETOROLAC TROMETHAMINE 30 MG/ML IJ SOLN
INTRAMUSCULAR | Status: DC | PRN
  Administered 2011-07-29: 30 mg via INTRAVENOUS

## 2011-07-29 MED ORDER — PROPOFOL 10 MG/ML IV EMUL
INTRAVENOUS | Status: AC
  Filled 2011-07-29: qty 40

## 2011-07-29 MED ORDER — FENTANYL CITRATE 0.05 MG/ML IJ SOLN
INTRAMUSCULAR | Status: AC
  Filled 2011-07-29: qty 2

## 2011-07-29 MED ORDER — KETOROLAC TROMETHAMINE 30 MG/ML IJ SOLN
15.0000 mg | Freq: Once | INTRAMUSCULAR | Status: AC | PRN
  Administered 2011-07-29: 30 mg via INTRAVENOUS

## 2011-07-29 MED ORDER — NEOSTIGMINE METHYLSULFATE 1 MG/ML IJ SOLN
INTRAMUSCULAR | Status: DC | PRN
  Administered 2011-07-29: 2 mg via INTRAVENOUS

## 2011-07-29 MED ORDER — ROCURONIUM BROMIDE 50 MG/5ML IV SOLN
INTRAVENOUS | Status: AC
  Filled 2011-07-29: qty 1

## 2011-07-29 MED ORDER — ONDANSETRON HCL 4 MG/2ML IJ SOLN
INTRAMUSCULAR | Status: DC | PRN
  Administered 2011-07-29: 4 mg via INTRAVENOUS

## 2011-07-29 MED ORDER — BISACODYL 5 MG PO TBEC: 5.0000 mg | DELAYED_RELEASE_TABLET | Freq: Every day | ORAL | Status: DC | PRN

## 2011-07-29 MED ORDER — MIDAZOLAM HCL 2 MG/2ML IJ SOLN
INTRAMUSCULAR | Status: AC
  Filled 2011-07-29: qty 2

## 2011-07-29 MED ORDER — MIDAZOLAM HCL 5 MG/5ML IJ SOLN
INTRAMUSCULAR | Status: DC | PRN
  Administered 2011-07-29: 2 mg via INTRAVENOUS

## 2011-07-29 MED ORDER — SUFENTANIL CITRATE 50 MCG/ML IV SOLN
INTRAVENOUS | Status: AC
  Filled 2011-07-29: qty 1

## 2011-07-29 MED ORDER — DIPHENHYDRAMINE HCL 12.5 MG/5ML PO ELIX
12.5000 mg | ORAL_SOLUTION | Freq: Four times a day (QID) | ORAL | Status: DC | PRN
  Filled 2011-07-29: qty 5

## 2011-07-29 MED ORDER — GLYCOPYRROLATE 0.2 MG/ML IJ SOLN
INTRAMUSCULAR | Status: DC | PRN
  Administered 2011-07-29: .4 mg via INTRAVENOUS

## 2011-07-29 MED ORDER — DEXAMETHASONE SODIUM PHOSPHATE 10 MG/ML IJ SOLN
INTRAMUSCULAR | Status: AC
  Filled 2011-07-29: qty 1

## 2011-07-29 MED ORDER — DIPHENHYDRAMINE HCL 50 MG/ML IJ SOLN: 12.5000 mg | Freq: Four times a day (QID) | INTRAMUSCULAR | Status: DC | PRN

## 2011-07-29 MED ORDER — HYDROMORPHONE 0.3 MG/ML IV SOLN
INTRAVENOUS | Status: AC
  Filled 2011-07-29: qty 25

## 2011-07-29 MED ORDER — SODIUM CHLORIDE 0.9 % IJ SOLN: 9.0000 mL | INTRAMUSCULAR | Status: DC | PRN

## 2011-07-29 MED ORDER — PROPOFOL 10 MG/ML IV EMUL
INTRAVENOUS | Status: AC
  Filled 2011-07-29: qty 20

## 2011-07-29 MED ORDER — DEXTROSE-NACL 5-0.45 % IV SOLN
INTRAVENOUS | Status: DC
  Administered 2011-07-29 – 2011-07-30 (×2): via INTRAVENOUS

## 2011-07-29 MED ORDER — BISACODYL 10 MG RE SUPP: 10.0000 mg | Freq: Every day | RECTAL | Status: DC | PRN

## 2011-07-29 MED ORDER — HYDROMORPHONE 0.3 MG/ML IV SOLN
INTRAVENOUS | Status: DC
  Administered 2011-07-29: 2 mg via INTRAVENOUS
  Administered 2011-07-29: 17:00:00 via INTRAVENOUS
  Administered 2011-07-29: 2.7 mg via INTRAVENOUS
  Administered 2011-07-30: 02:00:00 via INTRAVENOUS
  Administered 2011-07-30: 3.6 mg via INTRAVENOUS
  Administered 2011-07-30: 2.7 mg via INTRAVENOUS

## 2011-07-29 MED ORDER — SIMETHICONE 80 MG PO CHEW: 80.0000 mg | CHEWABLE_TABLET | Freq: Four times a day (QID) | ORAL | Status: DC | PRN

## 2011-07-29 MED ORDER — PROMETHAZINE HCL 25 MG/ML IJ SOLN: 6.2500 mg | INTRAMUSCULAR | Status: DC | PRN

## 2011-07-29 MED ORDER — TRAMADOL HCL 50 MG PO TABS: 50.0000 mg | ORAL_TABLET | Freq: Four times a day (QID) | ORAL | Status: DC | PRN

## 2011-07-29 MED ORDER — MAGNESIUM HYDROXIDE 400 MG/5ML PO SUSP: 30.0000 mL | Freq: Every day | ORAL | Status: DC | PRN

## 2011-07-29 MED ORDER — MAGNESIUM CITRATE PO SOLN: 296.0000 mL | Freq: Every day | ORAL | Status: DC | PRN

## 2011-07-29 MED ORDER — DEXAMETHASONE SODIUM PHOSPHATE 10 MG/ML IJ SOLN
INTRAMUSCULAR | Status: DC | PRN
  Administered 2011-07-29: 10 mg via INTRAVENOUS

## 2011-07-29 MED ORDER — KETOROLAC TROMETHAMINE 30 MG/ML IJ SOLN
INTRAMUSCULAR | Status: AC
  Filled 2011-07-29: qty 1

## 2011-07-29 MED ORDER — CEFAZOLIN SODIUM 1-5 GM-% IV SOLN: 1.0000 g | INTRAVENOUS | Status: DC

## 2011-07-29 MED ORDER — ONDANSETRON HCL 4 MG/2ML IJ SOLN
INTRAMUSCULAR | Status: AC
  Filled 2011-07-29: qty 2

## 2011-07-29 MED ORDER — FENTANYL CITRATE 0.05 MG/ML IJ SOLN
25.0000 ug | INTRAMUSCULAR | Status: DC | PRN
  Administered 2011-07-29 (×3): 50 ug via INTRAVENOUS

## 2011-07-29 MED ORDER — ACETAMINOPHEN 325 MG PO TABS: 325.0000 mg | ORAL_TABLET | ORAL | Status: DC | PRN

## 2011-07-29 MED ORDER — NEOSTIGMINE METHYLSULFATE 1 MG/ML IJ SOLN
INTRAMUSCULAR | Status: AC
  Filled 2011-07-29: qty 10

## 2011-07-29 MED ORDER — GLYCOPYRROLATE 0.2 MG/ML IJ SOLN
INTRAMUSCULAR | Status: AC
  Filled 2011-07-29: qty 2

## 2011-07-29 MED ORDER — LIDOCAINE HCL (CARDIAC) 20 MG/ML IV SOLN
INTRAVENOUS | Status: AC
  Filled 2011-07-29: qty 5

## 2011-07-29 MED ORDER — DOCUSATE SODIUM 100 MG PO CAPS: 100.0000 mg | ORAL_CAPSULE | Freq: Every day | ORAL | Status: DC

## 2011-07-29 MED ORDER — RINGERS IRRIGATION IR SOLN
Status: DC | PRN
  Administered 2011-07-29: 1

## 2011-07-29 MED ORDER — BUPIVACAINE HCL (PF) 0.25 % IJ SOLN
INTRAMUSCULAR | Status: DC | PRN
  Administered 2011-07-29: 7 mL

## 2011-07-29 MED ORDER — CEFAZOLIN SODIUM 1-5 GM-% IV SOLN
INTRAVENOUS | Status: AC
  Administered 2011-07-29: 1 g via INTRAVENOUS
  Filled 2011-07-29: qty 50

## 2011-07-29 MED ORDER — SENNOSIDES-DOCUSATE SODIUM 8.6-50 MG PO TABS: 2.0000 | ORAL_TABLET | Freq: Every day | ORAL | Status: DC | PRN

## 2011-07-29 MED ORDER — SUFENTANIL CITRATE 50 MCG/ML IV SOLN
INTRAVENOUS | Status: DC | PRN
  Administered 2011-07-29: 10 ug via INTRAVENOUS
  Administered 2011-07-29 (×2): 15 ug via INTRAVENOUS
  Administered 2011-07-29: 10 ug via INTRAVENOUS

## 2011-07-29 MED ORDER — ROCURONIUM BROMIDE 100 MG/10ML IV SOLN
INTRAVENOUS | Status: DC | PRN
  Administered 2011-07-29: 20 mg via INTRAVENOUS
  Administered 2011-07-29: 50 mg via INTRAVENOUS

## 2011-07-29 MED ORDER — PROPOFOL 10 MG/ML IV EMUL
INTRAVENOUS | Status: DC | PRN
  Administered 2011-07-29: 50 mg via INTRAVENOUS
  Administered 2011-07-29: 150 mg via INTRAVENOUS
  Administered 2011-07-29: 50 mg via INTRAVENOUS

## 2011-07-29 MED ORDER — ONDANSETRON HCL 4 MG/2ML IJ SOLN: 4.0000 mg | Freq: Four times a day (QID) | INTRAMUSCULAR | Status: DC | PRN

## 2011-07-29 MED ORDER — ALUM & MAG HYDROXIDE-SIMETH 200-200-20 MG/5ML PO SUSP: 30.0000 mL | ORAL | Status: DC | PRN

## 2011-07-29 MED ORDER — LACTATED RINGERS IV SOLN
INTRAVENOUS | Status: DC
  Administered 2011-07-29 (×3): via INTRAVENOUS

## 2011-07-29 SURGICAL SUPPLY — 72 items
ADH SKN CLS APL DERMABOND .7 (GAUZE/BANDAGES/DRESSINGS) ×1
BAG URINE DRAINAGE (UROLOGICAL SUPPLIES) ×2 IMPLANT
BARRIER ADHS 3X4 INTERCEED (GAUZE/BANDAGES/DRESSINGS) ×2 IMPLANT
BLADELESS LONG 8MM (BLADE) IMPLANT
BRR ADH 4X3 ABS CNTRL BYND (GAUZE/BANDAGES/DRESSINGS) ×1
CABLE HIGH FREQUENCY MONO STRZ (ELECTRODE) ×2 IMPLANT
CATH FOLEY 3WAY  5CC 16FR (CATHETERS) ×1
CATH FOLEY 3WAY 5CC 16FR (CATHETERS) ×1 IMPLANT
CHLORAPREP W/TINT 26ML (MISCELLANEOUS) ×2 IMPLANT
CLOTH BEACON ORANGE TIMEOUT ST (SAFETY) ×2 IMPLANT
CONT PATH 16OZ SNAP LID 3702 (MISCELLANEOUS) ×2 IMPLANT
COVER MAYO STAND STRL (DRAPES) ×2 IMPLANT
COVER TABLE BACK 60X90 (DRAPES) ×4 IMPLANT
COVER TIP SHEARS 8 DVNC (MISCELLANEOUS) ×1 IMPLANT
COVER TIP SHEARS 8MM DA VINCI (MISCELLANEOUS) ×1
DECANTER SPIKE VIAL GLASS SM (MISCELLANEOUS) ×2 IMPLANT
DERMABOND ADVANCED (GAUZE/BANDAGES/DRESSINGS) ×1
DERMABOND ADVANCED .7 DNX12 (GAUZE/BANDAGES/DRESSINGS) ×1 IMPLANT
DRAPE HUG U DISPOSABLE (DRAPE) ×2 IMPLANT
DRAPE HYSTEROSCOPY (DRAPE) IMPLANT
DRAPE LG THREE QUARTER DISP (DRAPES) ×4 IMPLANT
DRAPE MONITOR DA VINCI (DRAPE) ×2 IMPLANT
DRAPE WARM FLUID 44X44 (DRAPE) ×2 IMPLANT
ELECT REM PT RETURN 9FT ADLT (ELECTROSURGICAL) ×2
ELECTRODE REM PT RTRN 9FT ADLT (ELECTROSURGICAL) ×1 IMPLANT
EVACUATOR SMOKE 8.L (FILTER) ×2 IMPLANT
GAUZE VASELINE 3X9 (GAUZE/BANDAGES/DRESSINGS) IMPLANT
GLOVE BIO SURGEON STRL SZ7.5 (GLOVE) ×6 IMPLANT
GOWN PREVENTION PLUS LG XLONG (DISPOSABLE) ×6 IMPLANT
GOWN PREVENTION PLUS XLARGE (GOWN DISPOSABLE) ×2 IMPLANT
GYRUS RUMI II 2.5CM BLUE (DISPOSABLE)
GYRUS RUMI II 3.5CM BLUE (DISPOSABLE)
GYRUS RUMI II 4.0CM BLUE (DISPOSABLE)
KIT DISP ACCESSORY 4 ARM (KITS) ×2 IMPLANT
NDL INSUFFLATION 14GA 120MM (NEEDLE) ×1 IMPLANT
NEEDLE INSUFFLATION 14GA 120MM (NEEDLE) ×2 IMPLANT
PACK LAVH (CUSTOM PROCEDURE TRAY) ×2 IMPLANT
PAD PREP 24X48 CUFFED NSTRL (MISCELLANEOUS) ×4 IMPLANT
PLUG CATH AND CAP STER (CATHETERS) ×2 IMPLANT
POSITIONER SURGICAL ARM (MISCELLANEOUS) ×4 IMPLANT
RUMI II 3.0CM BLUE KOH-EFFICIE (DISPOSABLE) IMPLANT
RUMI II GYRUS 2.5CM BLUE (DISPOSABLE) IMPLANT
RUMI II GYRUS 3.5CM BLUE (DISPOSABLE) IMPLANT
RUMI II GYRUS 4.0CM BLUE (DISPOSABLE) IMPLANT
SET IRRIG TUBING LAPAROSCOPIC (IRRIGATION / IRRIGATOR) ×2 IMPLANT
SOLUTION ELECTROLUBE (MISCELLANEOUS) ×2 IMPLANT
SPONGE LAP 18X18 X RAY DECT (DISPOSABLE) IMPLANT
SUT VIC AB 0 CT1 27 (SUTURE) ×4
SUT VIC AB 0 CT1 27XBRD ANBCTR (SUTURE) ×2 IMPLANT
SUT VIC AB 0 CT1 27XBRD ANTBC (SUTURE) IMPLANT
SUT VIC AB 0 CT2 27 (SUTURE) IMPLANT
SUT VICRYL 0 27 CT2 27 ABS (SUTURE) IMPLANT
SUT VICRYL 0 UR6 27IN ABS (SUTURE) ×2 IMPLANT
SUT VICRYL RAPIDE 4/0 PS 2 (SUTURE) ×4 IMPLANT
SYR 50ML LL SCALE MARK (SYRINGE) ×2 IMPLANT
SYRINGE 10CC LL (SYRINGE) ×2 IMPLANT
SYSTEM CONVERTIBLE TROCAR (TROCAR) IMPLANT
TIP UTERINE 5.1X6CM LAV DISP (MISCELLANEOUS) IMPLANT
TIP UTERINE 6.7X10CM GRN DISP (MISCELLANEOUS) IMPLANT
TIP UTERINE 6.7X6CM WHT DISP (MISCELLANEOUS) IMPLANT
TIP UTERINE 6.7X8CM BLUE DISP (MISCELLANEOUS) ×1 IMPLANT
TOWEL OR 17X24 6PK STRL BLUE (TOWEL DISPOSABLE) ×6 IMPLANT
TROCAR DISP BLADELESS 8 DVNC (TROCAR) ×1 IMPLANT
TROCAR DISP BLADELESS 8MM (TROCAR) ×1
TROCAR XCEL 12X100 BLDLESS (ENDOMECHANICALS) IMPLANT
TROCAR XCEL NON-BLD 5MMX100MML (ENDOMECHANICALS) ×2 IMPLANT
TROCAR Z-THREAD 12X150 (TROCAR) ×2 IMPLANT
TROCAR Z-THREAD BLADED 12X100M (TROCAR) IMPLANT
TROCAR Z-THREAD FIOS 12X100MM (TROCAR) IMPLANT
TUBING FILTER THERMOFLATOR (ELECTROSURGICAL) ×2 IMPLANT
WARMER LAPAROSCOPE (MISCELLANEOUS) ×2 IMPLANT
WATER STERILE IRR 1000ML POUR (IV SOLUTION) ×6 IMPLANT

## 2011-07-29 NOTE — Anesthesia Postprocedure Evaluation (Signed)
Anesthesia Post Note  Patient: [name]  Procedure(s) Performed:  ROBOTIC ASSISTED TOTAL HYSTERECTOMY WITH SALPINGO OOPHERECTOMY - Robotic Assisted Total Hysterectomy with Left Salpingo-Oophorectomy  Anesthesia type: General  Patient location: PACU  Post pain: Pain level controlled  Post assessment: Post-op Vital signs reviewed  Last Vitals:  Filed Vitals:   07/29/11 1500  BP: 113/62  Pulse: 106  Temp:   Resp: 22    Post vital signs: Reviewed  Level of consciousness: sedated  Complications: No apparent anesthesia complicationsfj

## 2011-07-29 NOTE — Progress Notes (Signed)
  Update done. Exam done. Consent signed. TLH and LSO- left side marked as noted.

## 2011-07-29 NOTE — Transfer of Care (Signed)
Immediate Anesthesia Transfer of Care Note  Patient: [name]  Procedure(s) Performed:  ROBOTIC ASSISTED TOTAL HYSTERECTOMY WITH SALPINGO OOPHERECTOMY - Robotic Assisted Total Hysterectomy with Left Salpingo-Oophorectomy  Patient Location: PACU  Anesthesia Type: General  Level of Consciousness: alert , oriented and sedated  Airway & Oxygen Therapy: Patient Spontanous Breathing and Patient connected to nasal cannula oxygen  Post-op Assessment: Report given to PACU RN and Post -op Vital signs reviewed and stable  Post vital signs: stable  Complications: No apparent anesthesia complications

## 2011-07-29 NOTE — Anesthesia Preprocedure Evaluation (Addendum)
Anesthesia Evaluation  Patient identified by MRN, date of birth, ID band Patient awake  General Assessment Comment  Reviewed: Allergy & Precautions, H&P , Patient's Chart, lab work & pertinent test results, reviewed documented beta blocker date and time   History of Anesthesia Complications Negative for: history of anesthetic complications  Airway Mallampati: II TM Distance: >3 FB Neck ROM: full    Dental No notable dental hx.    Pulmonary  clear to auscultation  Pulmonary exam normal       Cardiovascular Exercise Tolerance: Good regular Normal    Neuro/Psych Negative Neurological ROS  Negative Psych ROS   GI/Hepatic negative GI ROS Neg liver ROS    Endo/Other  Negative Endocrine ROS  Renal/GU negative Renal ROS     Musculoskeletal   Abdominal   Peds  Hematology negative hematology ROS (+)   Anesthesia Other Findings smoker  Reproductive/Obstetrics negative OB ROS                          Anesthesia Physical Anesthesia Plan  ASA: II  Anesthesia Plan: General   Post-op Pain Management:    Induction:   Airway Management Planned:   Additional Equipment:   Intra-op Plan:   Post-operative Plan:   Informed Consent: I have reviewed the patients History and Physical, chart, labs and discussed the procedure including the risks, benefits and alternatives for the proposed anesthesia with the patient or authorized representative who has indicated his/her understanding and acceptance.   Dental Advisory Given  Plan Discussed with: CRNA and Surgeon  Anesthesia Plan Comments:         Anesthesia Quick Evaluation

## 2011-07-29 NOTE — Op Note (Signed)
07/29/2011  2:49 PM  PATIENT:  [name]  35 y.o. female  PRE-OPERATIVE DIAGNOSIS:  Endometriosis; Left Lower Abdominal Quadrant Pelvic Pain  POST-OPERATIVE DIAGNOSIS:  Endometriosis; Left Lower Abdominal Quadrant Pelvic Pain  PROCEDURE:  Procedure(s): ROBOTIC ASSISTED TOTAL HYSTERECTOMY WITH SALPINGO OOPHERECTOMY(left) Lysis of Adhesions McCall cul de plasty Ablation of endometriosis  SURGEON:  Surgeon(s): Lenoard Aden, MD  ASSISTANTS:  PaUL, CMN  ANESTHESIA:   local and general  ESTIMATED BLOOD LOSS: * No blood loss amount entered *   DRAINS: Urinary Catheter (Foley)   LOCAL MEDICATIONS USED:  MARCAINE 10CC  SPECIMEN:  Source of Specimen:  uterus, cervix and left tube an ovary  DISPOSITION OF SPECIMEN:  PATHOLOGY  COUNTS:  YES  DICTATION #: [id number]  PLAN OF CARE: DC in am  PATIENT DISPOSITION:  PACU - hemodynamically stable.

## 2011-07-29 NOTE — Op Note (Signed)
[name], [name]                ACCOUNT NO.:  [id number]  MEDICAL RECORD NO.:  [id number]  LOCATION:  9309                          FACILITY:  [facility]  PHYSICIAN:  Lenoard Aden, M.D.DATE OF BIRTH:  November 13, 1975  DATE OF PROCEDURE:  07/29/2011 DATE OF DISCHARGE:                              OPERATIVE REPORT   DESCRIPTION OF PROCEDURE:  After being apprised of risks of anesthesia, infection, bleeding, injury to abdominal organs, need for repair, delayed versus immediate complications include bowel and bladder injury, possible need for repair, the patient was brought to the operating room and was administered general anesthetic without complications, was prepped in usual sterile fashion.  Foley catheter was placed.  RUMI retractor was placed per vagina.  Upon placement of the RUMI into a NovaSure previously treated uterus, uterine perforation is suspected. RUMI cup, however, was inflated in standard fashion, and attention was turned to the abdomen whereby an infraumbilical incision was made with a scalpel.  Veress needle was placed.  Opening pressure -1, 3 L CO2 insufflated without difficulty.  Trocar was placed in the umbilicus.  At this time, visualization reveals a perforated uterus at the fundus with the RUMI retractor intact.  No evidence of injury to bowel or cul-de-sac structures.  At this time, there are evidence of endometriosis on the right and left ovary and in the cul-de-sac.  The liver and gallbladder area appears normal.  The appendix area appears normal.  At this time, I placed 8-mm trocars bilaterally in the right and left lower quadrant under direct visualization, and a 5-mm port in the left upper abdomen was placed as well.  At this time, PK forceps and Endo Shears were placed respectively and the procedure was initiated after establishing deep Trendelenburg position.  The left infundibulopelvic ligament was identified and the ureter seemed to be peristalsing  normally.  The left infundibulopelvic ligament was cauterized using the PK forceps, divided, and the broad ligament was entered, opened, and dissected in a blunt fashion.  The round ligament was then cauterized with the PK forceps and ligated.  The bladder flap was developed sharply using the Endo Shears and the RUMI cup was identified, palpable through the cuff.  At this time, the right ureter seemed to be peristalsing normally.  The tubo- ovarian round ligament was entered and the broad ligament was divided and retroperitoneal space was entered.  The ureters were identified bilaterally and found to be peristalsing normally.  The uterine vessels were then skeletonized bilaterally, clamped, and cut.  The endometriosis on the right ovary was ablated using the PK forceps and Endo Shears without difficulty.  Good hemostasis was noted.  After division of the uterine vessels, the RUMI cup was exposed 360 degrees at the cervicovaginal junction and the specimen was retracted into the vagina without difficulty.  At this time, irrigation was accomplished and the vaginal cuff was closed using 0 V lock suture in a continuous running fashion.  Good hemostasis was noted.  McCall culdoplasty suture placed. At this time, good hemostasis was assured.  CO2 was released, then re- insufflation reveals good hemostasis.  All instruments were then removed from the abdomen and the robot was undocked  at this time.  After undocking robot, the V reinspection reveals good hemostasis.  The trocars were all removed under direct visualization.  CO2 released. Incision was closed using 0 Vicryl, 4-0 Vicryl, and Dermabond.  All specimens were removed from the vagina.  The vaginal cuff was inspected postprocedure and found to be intact.  The patient tolerated the procedure well and was awakened and transferred to recovery room in good condition.     Lenoard Aden, M.D.     RJT/MEDQ  D:  07/29/2011  T:   07/29/2011  Job:  [id number]

## 2011-07-30 LAB — CBC
HCT: 36.5 % (ref 36.0–46.0)
MCH: 32.6 pg (ref 26.0–34.0)
MCHC: 33.4 g/dL (ref 30.0–36.0)
MCV: 97.6 fL (ref 78.0–100.0)
Platelets: 269 10*3/uL (ref 150–400)
RDW: 13.2 % (ref 11.5–15.5)

## 2011-07-30 MED ORDER — OXYCODONE-ACETAMINOPHEN 5-325 MG PO TABS: 1.0000 | ORAL_TABLET | ORAL | Status: AC | PRN

## 2011-07-30 MED ORDER — HYDROMORPHONE 0.3 MG/ML IV SOLN
INTRAVENOUS | Status: AC
  Filled 2011-07-30: qty 25

## 2011-07-30 NOTE — Progress Notes (Signed)
1 Day Post-Op Procedure(s): ROBOTIC ASSISTED TOTAL HYSTERECTOMY WITH SALPINGO OOPHERECTOMY  Subjective: Patient reports incisional pain, tolerating PO, + flatus and no problems voiding.    Objective: I have reviewed patient's vital signs, intake and output, medications and labs.  General: alert, cooperative and appears stated age Resp: clear to auscultation bilaterally Cardio: regular rate and rhythm, S1, S2 normal, no murmur, click, rub or gallop GI: soft, non-tender; bowel sounds normal; no masses,  no organomegaly and incision: clean, dry and intact Extremities: extremities normal, atraumatic, no cyanosis or edema, Homans sign is negative, no sign of DVT and no edema, redness or tenderness in the calves or thighs Vaginal Bleeding: minimal  Assessment: s/p Procedure(s): ROBOTIC ASSISTED TOTAL HYSTERECTOMY WITH SALPINGO OOPHERECTOMY: stable, progressing well and tolerating diet  Plan: Advance diet Encourage ambulation Advance to PO medication Discontinue IV fluids Discharge home  LOS: 1 day    Kawana Hegel J 07/30/2011, 11:41 AM

## 2011-07-31 NOTE — Discharge Summary (Signed)
NAMEEMILYROSE, DARRAH                ACCOUNT NO.:  [id number]  MEDICAL RECORD NO.:  [id number]  LOCATION:  9309                          FACILITY:  [facility]  PHYSICIAN:  Lenoard Aden, M.D.DATE OF BIRTH:  Nov 16, 1975  DATE OF ADMISSION:  07/29/2011 DATE OF DISCHARGE:  07/30/2011                              DISCHARGE SUMMARY   ADMISSION DIAGNOSIS:  Severe dysmenorrhea, endometriosis.  DISCHARGE DIAGNOSIS:  Severe dysmenorrhea, endometriosis.  HOSPITAL COURSE:  The patient underwent uncomplicated Da Vinci assisted total laparoscopic hysterectomy, lysis of adhesions, _________ endometriosis, LSO on July 29, 2011.  Postoperative course was uncomplicated.  Hemoglobin and hematocrit within normal limits.  The patient ambulated without difficulty.  Discharged to home on postoperative day 1, tolerated liquid diet well, voiding without difficulty.  She is to follow up in the office in 1 week.  DISCHARGE MEDICATIONS:  Percocet and multivitamins.     Lenoard Aden, M.D.     RJT/MEDQ  D:  07/31/2011  T:  07/31/2011  Job:  [id number]

## 2012-07-26 IMAGING — CT CT ABD-PELV W/O CM
2 of 4 series · 16 of 46 positions shown, 18 images · non-contrast
Comparison: CT abdomen 11/26/2004

CLINICAL DATA: Set left lower quadrant flank pain.

CT ABDOMEN AND PELVIS WITHOUT CONTRAST
TECHNIQUE: Multidetector CT imaging of the abdomen and pelvis was
performed following the standard protocol without intravenous
contrast.

[Series 2: renal stone < 200 lbs 5.0 b31f · axial · 0.67mm/px · z∈[-474,-74]mm · 13 of 88 slices shown, 15 images]
[im 4/88  soft-tissue]
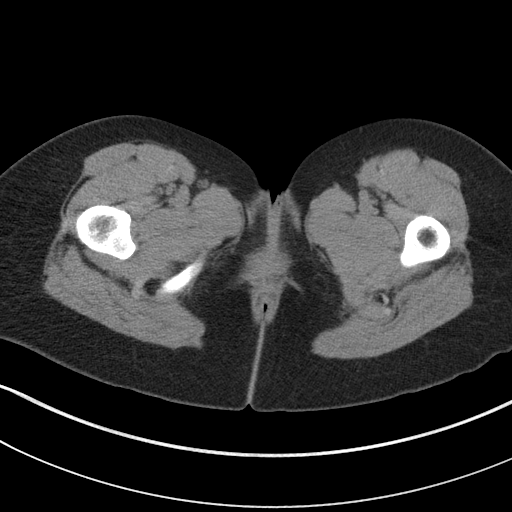
[im 4/88  bone]
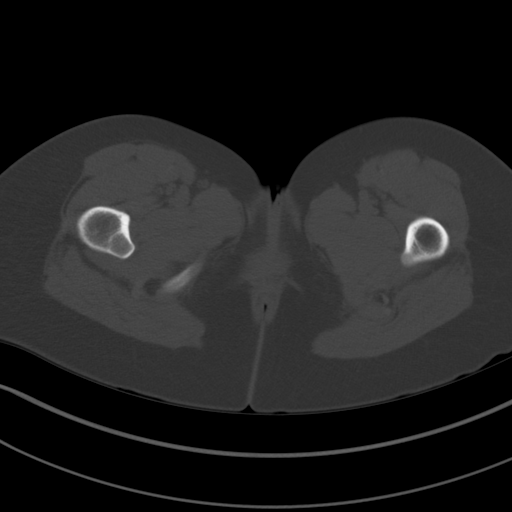
[im 11/88  soft-tissue]
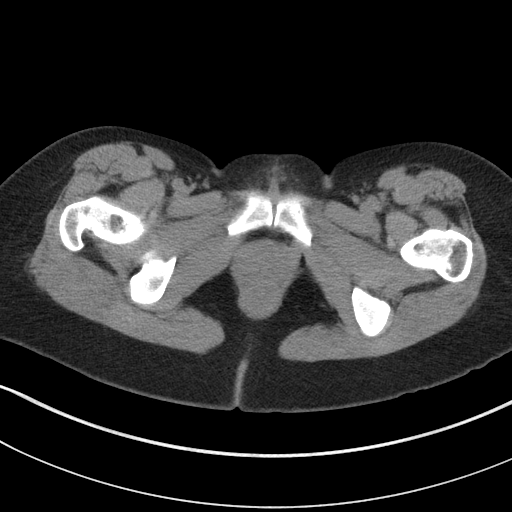
[im 17/88  soft-tissue]
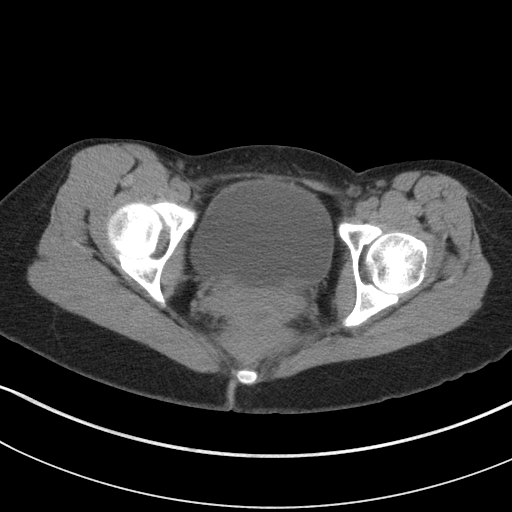
[im 24/88  soft-tissue]
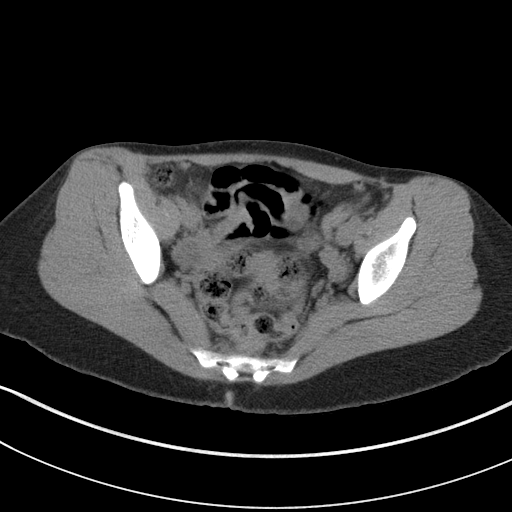
[im 31/88  soft-tissue]
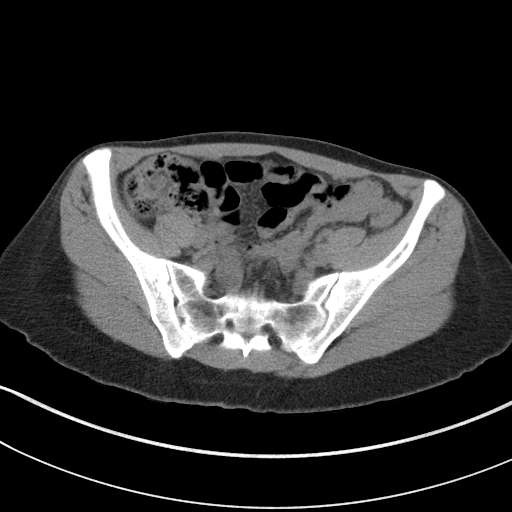
[im 37/88  soft-tissue]
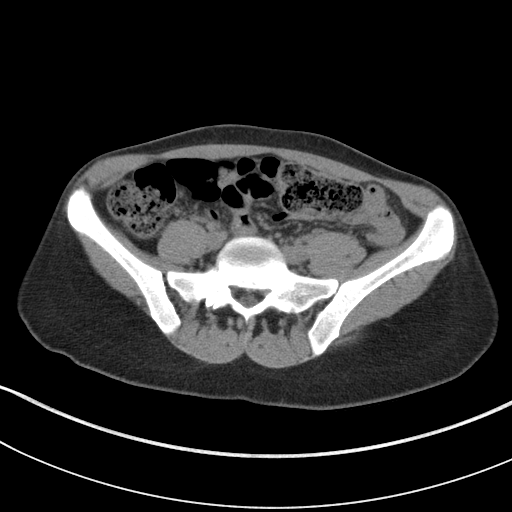
[im 44/88  soft-tissue]
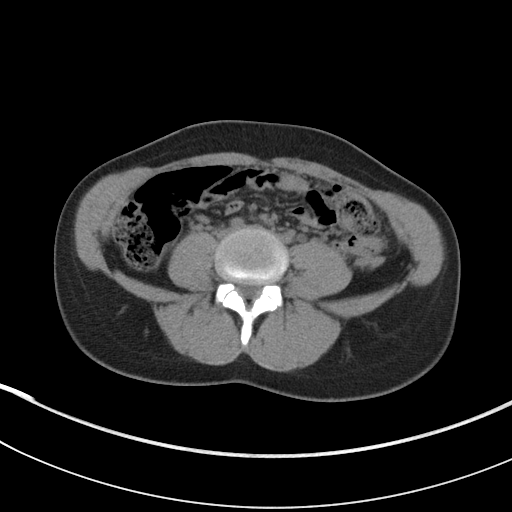
[im 51/88  soft-tissue]
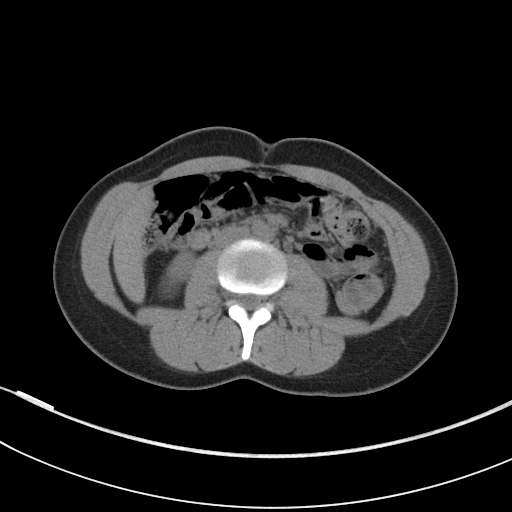
[im 57/88  soft-tissue]
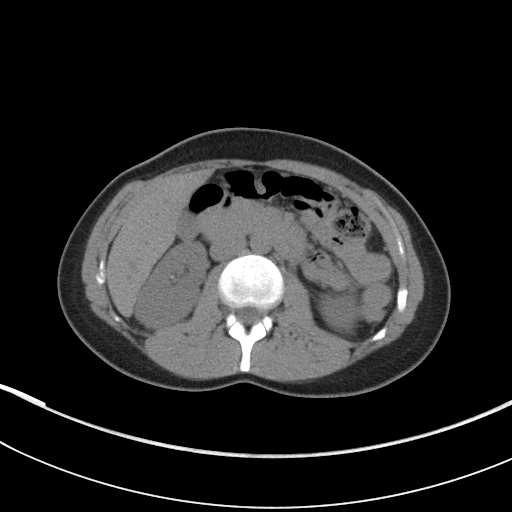
[im 57/88  bone]
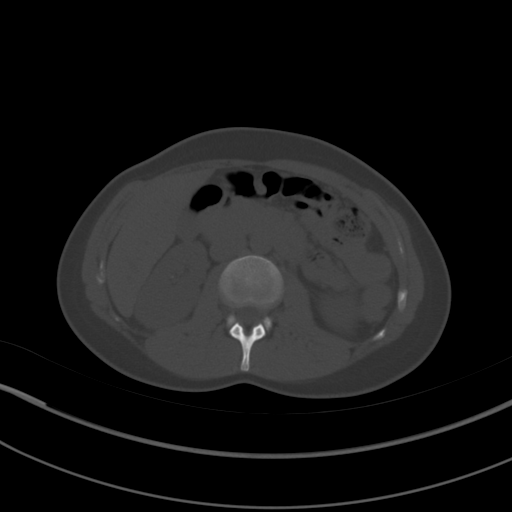
[im 64/88  soft-tissue]
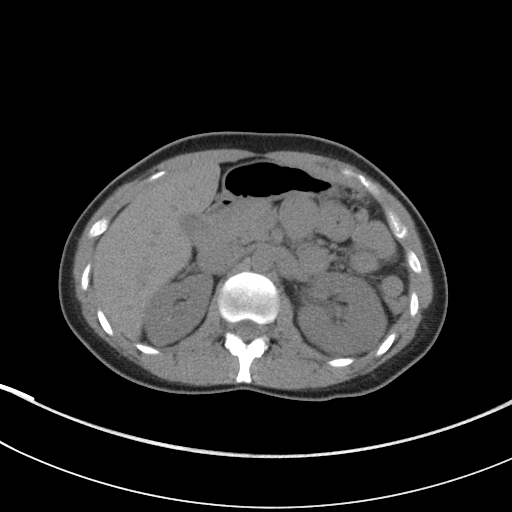
[im 71/88  soft-tissue]
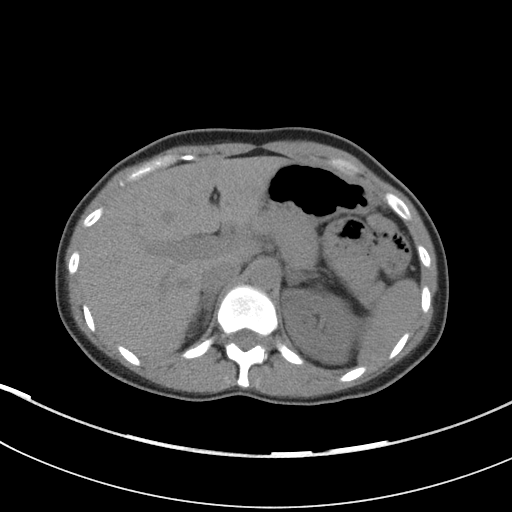
[im 77/88  soft-tissue]
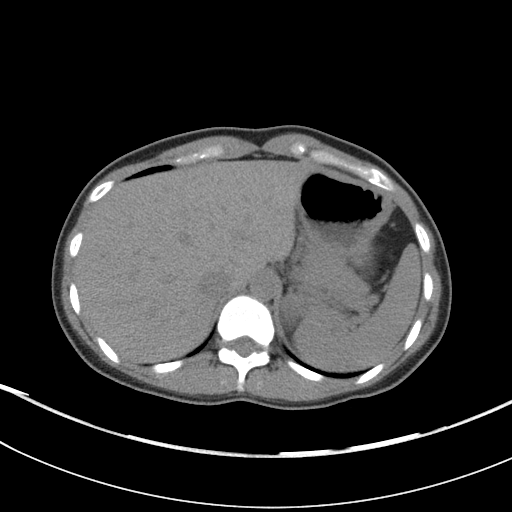
[im 84/88  soft-tissue]
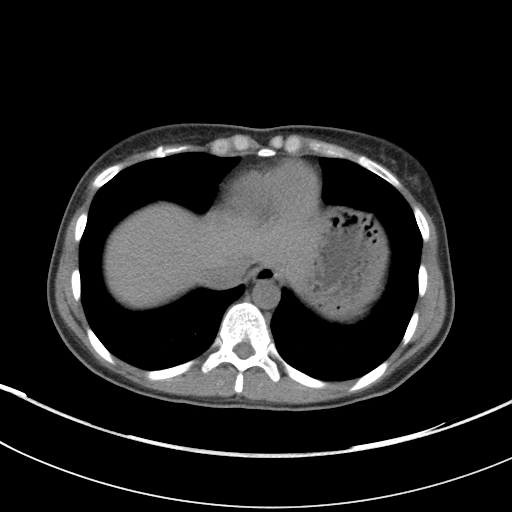

[Series 4: renal stone 3.0 coronal · coronal · 0.63mm/px · 3 of 57 slices shown]
[im 19/57  soft-tissue]
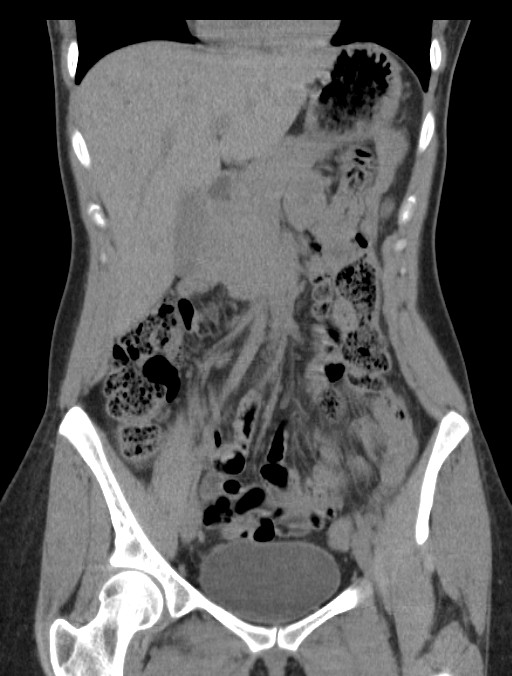
[im 25/57  soft-tissue]
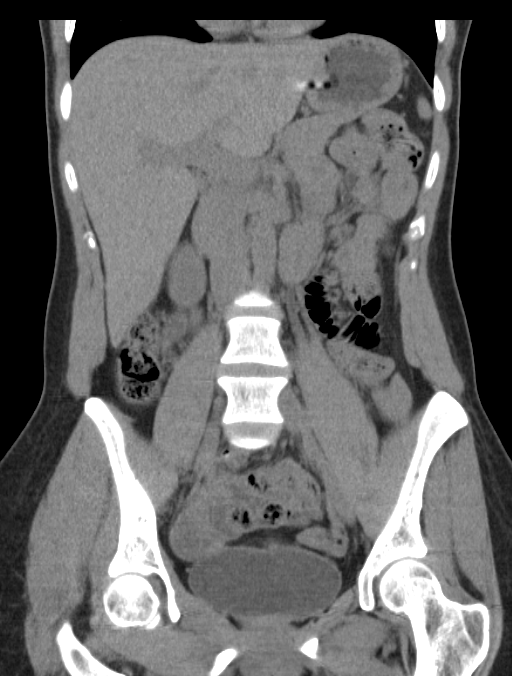
[im 32/57  soft-tissue]
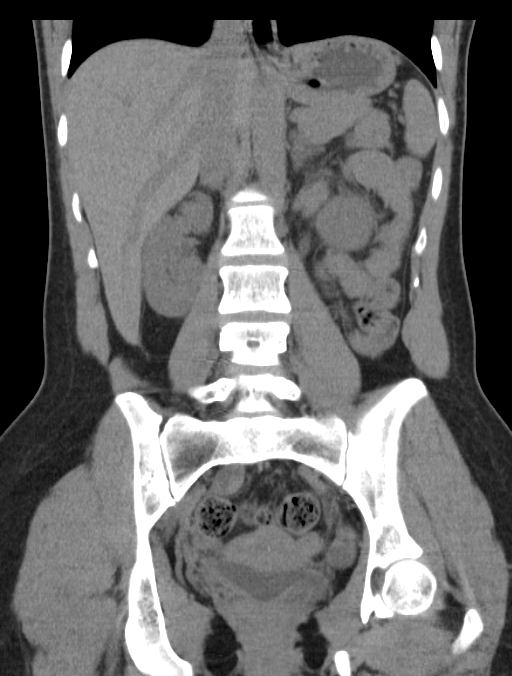

[16 of 46 positions shown; findings below may reference images not displayed]

FINDINGS: Lung bases are clear.  No focal hepatic lesion was
noncontrast exam.  The gallbladder, pancreas, spleen, adrenal
glands normal.

There is a 1 mm calculus within the mid right kidney (image 32).
No hydronephrosis.  No hydroureter.  There is a 2 mm calculus
within the right vesicoureteral junction.  This is on the vesicular
side of the junction may be completely within the bladder.  No
evidence of left nephrolithiasis or ureterolithiasis.

The stomach, small bowel, appendix, and cecum are normal.  The
colon rectosigmoid colon are normal. Uterus and ovaries are normal.
No free fluid the pelvis.
IMPRESSION: 1.  Small calculus within the right vesicoureteral junction.  The
stone is on the bladder side of the junction.
2.  Punctate calcification within the right kidney.
3.  Normal appendix.

## 2014-08-04 ENCOUNTER — Encounter (HOSPITAL_COMMUNITY): Payer: Self-pay | Admitting: Anesthesiology

## 2017-06-16 ENCOUNTER — Encounter (HOSPITAL_COMMUNITY): Payer: Self-pay | Admitting: Emergency Medicine

## 2017-06-16 ENCOUNTER — Emergency Department (HOSPITAL_COMMUNITY)
Admission: EM | Admit: 2017-06-16 | Discharge: 2017-06-17 | Disposition: A | Discharge status: Other Acute Inpatient Hospital | Payer: Self-pay | Attending: Emergency Medicine | Admitting: Emergency Medicine

## 2017-06-16 DIAGNOSIS — Y9389 Activity, other specified: Secondary | ICD-10-CM | POA: Insufficient documentation

## 2017-06-16 DIAGNOSIS — F17210 Nicotine dependence, cigarettes, uncomplicated: Secondary | ICD-10-CM | POA: Insufficient documentation

## 2017-06-16 DIAGNOSIS — T1491XA Suicide attempt, initial encounter: Secondary | ICD-10-CM | POA: Insufficient documentation

## 2017-06-16 DIAGNOSIS — Z79899 Other long term (current) drug therapy: Secondary | ICD-10-CM | POA: Insufficient documentation

## 2017-06-16 DIAGNOSIS — Y92009 Unspecified place in unspecified non-institutional (private) residence as the place of occurrence of the external cause: Secondary | ICD-10-CM | POA: Insufficient documentation

## 2017-06-16 DIAGNOSIS — Y999 Unspecified external cause status: Secondary | ICD-10-CM | POA: Insufficient documentation

## 2017-06-16 DIAGNOSIS — F333 Major depressive disorder, recurrent, severe with psychotic symptoms: Secondary | ICD-10-CM | POA: Diagnosis present

## 2017-06-16 DIAGNOSIS — R45851 Suicidal ideations: Secondary | ICD-10-CM | POA: Insufficient documentation

## 2017-06-16 DIAGNOSIS — X788XXA Intentional self-harm by other sharp object, initial encounter: Secondary | ICD-10-CM | POA: Insufficient documentation

## 2017-06-16 DIAGNOSIS — F419 Anxiety disorder, unspecified: Secondary | ICD-10-CM | POA: Insufficient documentation

## 2017-06-16 DIAGNOSIS — Z046 Encounter for general psychiatric examination, requested by authority: Secondary | ICD-10-CM | POA: Insufficient documentation

## 2017-06-16 DIAGNOSIS — F1994 Other psychoactive substance use, unspecified with psychoactive substance-induced mood disorder: Secondary | ICD-10-CM

## 2017-06-16 DIAGNOSIS — R451 Restlessness and agitation: Secondary | ICD-10-CM | POA: Insufficient documentation

## 2017-06-16 LAB — CBC
HCT: 40.3 % (ref 36.0–46.0)
Hemoglobin: 14.3 g/dL (ref 12.0–15.0)
MCH: 32.8 pg (ref 26.0–34.0)
MCHC: 35.5 g/dL (ref 30.0–36.0)
MCV: 92.4 fL (ref 78.0–100.0)
PLATELETS: 345 10*3/uL (ref 150–400)
RBC: 4.36 MIL/uL (ref 3.87–5.11)
RDW: 13 % (ref 11.5–15.5)
WBC: 10.8 10*3/uL — AB (ref 4.0–10.5)

## 2017-06-16 LAB — I-STAT BETA HCG BLOOD, ED (MC, WL, AP ONLY)

## 2017-06-16 NOTE — ED Notes (Signed)
Patient provided with cup to obtain urine sample as she was going to the bathroom. Patient did not get sample stating "I can't afford this."

## 2017-06-16 NOTE — ED Triage Notes (Addendum)
Per [employer], [employer] called for verbal altercation with husband. Patient's best friend reports patient was making suicidal statements and attempted to cut wrist with razor. Patient reports having two drinks after work. Denies drug use.   Patient denies SI. Tearful in triage. Stating "I'm just upset because my husband cheated on me."

## 2017-06-16 NOTE — ED Notes (Signed)
Unable to collect labs patient refused 

## 2017-06-17 ENCOUNTER — Inpatient Hospital Stay (HOSPITAL_COMMUNITY)
Admission: AD | Admit: 2017-06-17 | Discharge: 2017-06-19 | DRG: 885 | Disposition: A | Discharge status: Home / Self Care | Payer: Federal, State, Local not specified - Other | Attending: Psychiatry | Admitting: Psychiatry

## 2017-06-17 ENCOUNTER — Encounter (HOSPITAL_COMMUNITY): Payer: Self-pay | Admitting: General Practice

## 2017-06-17 DIAGNOSIS — F909 Attention-deficit hyperactivity disorder, unspecified type: Secondary | ICD-10-CM | POA: Diagnosis present

## 2017-06-17 DIAGNOSIS — Y904 Blood alcohol level of 80-99 mg/100 ml: Secondary | ICD-10-CM | POA: Diagnosis present

## 2017-06-17 DIAGNOSIS — F17210 Nicotine dependence, cigarettes, uncomplicated: Secondary | ICD-10-CM | POA: Diagnosis present

## 2017-06-17 DIAGNOSIS — F333 Major depressive disorder, recurrent, severe with psychotic symptoms: Secondary | ICD-10-CM | POA: Diagnosis present

## 2017-06-17 DIAGNOSIS — F1994 Other psychoactive substance use, unspecified with psychoactive substance-induced mood disorder: Secondary | ICD-10-CM | POA: Diagnosis present

## 2017-06-17 DIAGNOSIS — F1014 Alcohol abuse with alcohol-induced mood disorder: Secondary | ICD-10-CM | POA: Diagnosis present

## 2017-06-17 DIAGNOSIS — G4700 Insomnia, unspecified: Secondary | ICD-10-CM | POA: Diagnosis present

## 2017-06-17 DIAGNOSIS — Z887 Allergy status to serum and vaccine status: Secondary | ICD-10-CM

## 2017-06-17 DIAGNOSIS — Z79899 Other long term (current) drug therapy: Secondary | ICD-10-CM | POA: Diagnosis not present

## 2017-06-17 DIAGNOSIS — F322 Major depressive disorder, single episode, severe without psychotic features: Secondary | ICD-10-CM | POA: Insufficient documentation

## 2017-06-17 HISTORY — DX: Endometriosis, unspecified: N80.9

## 2017-06-17 LAB — COMPREHENSIVE METABOLIC PANEL
ALT: 20 U/L (ref 14–54)
AST: 20 U/L (ref 15–41)
Albumin: 4.7 g/dL (ref 3.5–5.0)
Alkaline Phosphatase: 71 U/L (ref 38–126)
Anion gap: 9 (ref 5–15)
BUN: 14 mg/dL (ref 6–20)
CO2: 23 mmol/L (ref 22–32)
CREATININE: 0.79 mg/dL (ref 0.44–1.00)
Calcium: 9.1 mg/dL (ref 8.9–10.3)
Chloride: 108 mmol/L (ref 101–111)
GFR calc non Af Amer: >60 mL/min (ref >60)
Glucose, Bld: 109 mg/dL — ABNORMAL HIGH (ref 65–99)
POTASSIUM: 3.6 mmol/L (ref 3.5–5.1)
Sodium: 140 mmol/L (ref 135–145)
TOTAL PROTEIN: 7.7 g/dL (ref 6.5–8.1)
Total Bilirubin: 0.4 mg/dL (ref 0.3–1.2)

## 2017-06-17 LAB — RAPID URINE DRUG SCREEN, HOSP PERFORMED
Amphetamines: POSITIVE — AB
BARBITURATES: NOT DETECTED
Benzodiazepines: POSITIVE — AB
Cocaine: NOT DETECTED
OPIATES: NOT DETECTED
TETRAHYDROCANNABINOL: POSITIVE — AB

## 2017-06-17 LAB — SALICYLATE LEVEL: Salicylate Lvl: <7 mg/dL (ref 2.8–30.0)

## 2017-06-17 LAB — ACETAMINOPHEN LEVEL: Acetaminophen (Tylenol), Serum: <10 ug/mL — ABNORMAL LOW (ref 10–30)

## 2017-06-17 LAB — ETHANOL: ALCOHOL ETHYL (B): 94 mg/dL — AB (ref <5)

## 2017-06-17 MED ORDER — HYDROXYZINE HCL 25 MG PO TABS
25.0000 mg | ORAL_TABLET | Freq: Once | ORAL | Status: AC
  Administered 2017-06-17: 25 mg via ORAL
  Filled 2017-06-17: qty 1

## 2017-06-17 MED ORDER — HYDROXYZINE HCL 25 MG PO TABS: 25.0000 mg | ORAL_TABLET | Freq: Two times a day (BID) | ORAL | Status: DC | PRN

## 2017-06-17 MED ORDER — TRAZODONE HCL 50 MG PO TABS
50.0000 mg | ORAL_TABLET | Freq: Every evening | ORAL | Status: DC | PRN
  Administered 2017-06-17 – 2017-06-18 (×2): 50 mg via ORAL
  Filled 2017-06-17: qty 1
  Filled 2017-06-17: qty 7
  Filled 2017-06-17: qty 1
  Filled 2017-06-17: qty 7

## 2017-06-17 MED ORDER — ALUM & MAG HYDROXIDE-SIMETH 200-200-20 MG/5ML PO SUSP: 30.0000 mL | ORAL | Status: DC | PRN

## 2017-06-17 MED ORDER — FLUOXETINE HCL 10 MG PO CAPS
10.0000 mg | ORAL_CAPSULE | Freq: Every day | ORAL | Status: DC
  Filled 2017-06-17: qty 1

## 2017-06-17 MED ORDER — ACETAMINOPHEN 325 MG PO TABS: 650.0000 mg | ORAL_TABLET | Freq: Four times a day (QID) | ORAL | Status: DC | PRN

## 2017-06-17 MED ORDER — HYDROXYZINE HCL 25 MG PO TABS
25.0000 mg | ORAL_TABLET | Freq: Three times a day (TID) | ORAL | Status: DC | PRN
  Administered 2017-06-17 – 2017-06-18 (×2): 25 mg via ORAL
  Filled 2017-06-17: qty 10
  Filled 2017-06-17 (×2): qty 1

## 2017-06-17 MED ORDER — IBUPROFEN 400 MG PO TABS
400.0000 mg | ORAL_TABLET | ORAL | Status: DC | PRN
  Administered 2017-06-18 – 2017-06-19 (×3): 400 mg via ORAL
  Filled 2017-06-17 (×4): qty 1

## 2017-06-17 MED ORDER — LORAZEPAM 1 MG PO TABS: 1.0000 mg | ORAL_TABLET | Freq: Every evening | ORAL | Status: DC | PRN

## 2017-06-17 MED ORDER — MAGNESIUM HYDROXIDE 400 MG/5ML PO SUSP: 30.0000 mL | Freq: Every day | ORAL | Status: DC | PRN

## 2017-06-17 MED ORDER — GABAPENTIN 100 MG PO CAPS
200.0000 mg | ORAL_CAPSULE | Freq: Two times a day (BID) | ORAL | Status: DC
  Administered 2017-06-17: 200 mg via ORAL
  Filled 2017-06-17: qty 2
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
## 2017-06-17 NOTE — Tx Team (Addendum)
Initial Treatment Plan 06/17/2017 6:41 PM [name] ZOX:[id number]    PATIENT STRESSORS: Financial difficulties Marital or family conflict   PATIENT STRENGTHS: General fund of knowledge  Motivation for treatment and improvement   PATIENT IDENTIFIED PROBLEMS: Feeling overwhelmed  "I don't have any problems that require this"                   DISCHARGE CRITERIA:  Reduction of life-threatening or endangering symptoms to within safe limits  PRELIMINARY DISCHARGE PLAN: Return to previous living arrangement  PATIENT/FAMILY INVOLVEMENT: This treatment plan has been presented to and reviewed with the patient, [name].  The patient and family have been given the opportunity to ask questions and make suggestions.  [name], RN 06/17/2017, 6:41 PM
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
## 2017-06-17 NOTE — ED Notes (Signed)
Cup given to pt for urine specimen.

## 2017-06-17 NOTE — ED Notes (Signed)
Patient refused lunch
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
## 2017-06-17 NOTE — BH Assessment (Signed)
[facility] Assessment Progress Note 06/17/17: Patient has been accepted to [facility]. Status pending.

## 2017-06-17 NOTE — Progress Notes (Signed)
Writer spoke with patient shortly after shift change. She reported that she was getting used to being here. Writer inquired about her medications and she was informed of medications available. She appeared anxious and tearful and writer informed her about the schedule for the remainder of the night. She was encouraged to attend group but she decided that she just wanted to lie down and rest. Support given and safety maintained on unit with 15 min checks.

## 2017-06-17 NOTE — ED Notes (Signed)
Patient refused breakfast when offered.
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
## 2017-06-18 ENCOUNTER — Encounter (HOSPITAL_COMMUNITY): Payer: Self-pay | Admitting: General Practice

## 2017-06-18 DIAGNOSIS — Z79899 Other long term (current) drug therapy: Secondary | ICD-10-CM

## 2017-06-18 DIAGNOSIS — F1014 Alcohol abuse with alcohol-induced mood disorder: Secondary | ICD-10-CM

## 2017-06-18 DIAGNOSIS — F333 Major depressive disorder, recurrent, severe with psychotic symptoms: Principal | ICD-10-CM

## 2017-06-18 LAB — LIPID PANEL
CHOL/HDL RATIO: 2.9 ratio
CHOLESTEROL: 185 mg/dL (ref 0–200)
HDL: 64 mg/dL (ref >40)
LDL Cholesterol: 87 mg/dL (ref 0–99)
Triglycerides: 171 mg/dL — ABNORMAL HIGH (ref <150)
VLDL: 34 mg/dL (ref 0–40)

## 2017-06-18 LAB — TSH: TSH: 3.288 u[IU]/mL (ref 0.350–4.500)

## 2017-06-18 MED ORDER — SALINE SPRAY 0.65 % NA SOLN
1.0000 | NASAL | Status: DC | PRN
  Administered 2017-06-18: 1 via NASAL
  Filled 2017-06-18: qty 44

## 2017-06-18 MED ORDER — MENTHOL 3 MG MT LOZG
1.0000 | LOZENGE | OROMUCOSAL | Status: DC | PRN
  Administered 2017-06-18: 3 mg via ORAL

## 2017-06-18 MED ORDER — NICOTINE POLACRILEX 2 MG MT GUM
2.0000 mg | CHEWING_GUM | OROMUCOSAL | Status: DC | PRN
  Administered 2017-06-18: 2 mg via ORAL
  Filled 2017-06-18: qty 1

## 2017-06-18 MED ORDER — LORAZEPAM 1 MG PO TABS
1.0000 mg | ORAL_TABLET | Freq: Four times a day (QID) | ORAL | Status: DC | PRN
  Administered 2017-06-19: 1 mg via ORAL
  Filled 2017-06-18: qty 1
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
## 2017-06-18 NOTE — [facility] Group Notes (Signed)
[facility] Group Notes:  (Nursing/MHT/Case Management/Adjunct)  Date:  06/18/2017  Time:  3:32 PM  Type of Therapy:  Guided Meditation Group  Participation Level:  Active  Participation Quality:  Attentive  Affect:  Anxious  Cognitive:  Alert and Oriented  Insight:  Appropriate  Engagement in Group:  Engaged  Modes of Intervention:  Education and Exploration  Summary of Progress/Problems: Attended "guided meditation for new beginnings"; participated appropriately.   Charlaine Utsey M Rashon Rezek 06/18/2017, 3:32 PM 

## 2017-06-18 NOTE — Progress Notes (Signed)
D: Patient's self inventory sheet: patient has fair sleep, received sleep medication.poor  Appetite, normal energy level, good concentration. Rated depression 0/10, hopeless 0/10, anxiety 2/10. SI/HI/AVH: denies all. Physical complaints are denied. Goal is "be happy, go home". Plans to work on "whatever I need to".   A: Medications administered, assessed medication knowledge and education given on medication regimen.  Emotional support and encouragement given patient. R: Denies SI and HI , contracts for safety. Safety maintained with 15 minute checks.

## 2017-06-18 NOTE — Progress Notes (Signed)
Writer spoke with patient 1:1 and she reports her day being rough. She remains congested with nasal stuffiness. Orders received for nasal spray and throat lozenges to provide some relief. Her husband and son visited on tonight and she was glad to see them. She reports attending groups as much as possible but during meditation group when she began coughing she did not want to disturb the groups with her coughing and decided to isolate in her room. She reports that she should not be here but is cooperative. Support given and safety maintained with 15 min checks.
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
## 2017-06-18 NOTE — [facility] Group Notes (Signed)
[facility] LCSW Group Therapy Note  06/18/2017  10:15 to 11 AM   Type of Therapy and Topic: Group Therapy: Feelings Around Returning Home & Establishing a Supportive Framework   Participation Level: Active    Description of Group:  Patients first processed thoughts and feelings about up coming discharge. These included fears of upcoming changes, lack of change, new living environments, judgements and expectations from others and overall stigma of MH issues. We then discussed what is a supportive framework? What does it look like feel like and how do I discern it from and unhealthy non-supportive network? Learn how to cope when supports are not helpful and don't support you. Discuss what to do when your family/friends are not supportive.   Therapeutic Goals Addressed in Processing Group:  1. Patient will identify one healthy supportive network that they can use at discharge. 2. Patient will identify one factor of a supportive framework and how to tell it from an unhealthy network. 3. Patient able to identify one coping skill to use when they do not have positive supports from others. 4. Patient will demonstrate ability to communicate their needs through discussion and/or role plays.  Summary of Patient Progress:  Pt engaged easily during this her first group session. As patients processed their anxiety about discharge and described healthy supports patient shared she has all the supports she needs yet became tearful when processing her lack of self care.    Carney Bern, LCSW

## 2017-06-18 NOTE — [facility] Suicide Risk Assessment (Signed)
[facility] Admission Suicide Risk Assessment   Nursing information obtained from:  Patient Demographic factors:  NA Current Mental Status:  NA Loss Factors:  Financial problems / change in socioeconomic status, Loss of significant relationship Historical Factors:  NA Risk Reduction Factors:  Sense of responsibility to family, Employed, Living with another person, especially a relative  Total Time spent with patient: 30 minutes Principal Problem: <principal problem not specified> Diagnosis:   Patient Active Problem List   Diagnosis Date Noted  . Substance induced mood disorder [facility]) [F19.94] 06/17/2017   Subjective Data:  41 y.o Caucasian female, married, lives with her family, 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 Presented to the ER via the police. Family called and expressed concerns that patient was threatening to cut self with a razor. She was reported to have gotten into a rage following an argument with her husband and her best friend. Reported to have expressed anger towards her husband for an affair he had a year ago. Reported to have expressed concerns about losing her job. UDS was positive for amphetamines, benzodiazepines and THC. BAL was 94 mg/dl. Other parameters are WNL. Her husband and son has been very supportive since she has been in hospital and wants her home soon. No past suicidal behavior. No current suicidal thoughts. No planning of recent behavior. No access to weapons. Has a supportive family. No associated mania, psychosis or depression. Dis-inhibiting effects of alcohol, stimulants and benzodiazepines is an issue. Would hold off on them while she is here.   Continued Clinical Symptoms:  Alcohol Use Disorder Identification Test Final Score (AUDIT): 3 The "Alcohol Use Disorders Identification Test", Guidelines for Use in Primary Care, Second Edition.  World Science writer [facility]). Score between 0-7:  no or low risk or alcohol related problems. Score between 8-15:  moderate risk of  alcohol related problems. Score between 16-19:  high risk of alcohol related problems. Score 20 or above:  warrants further diagnostic evaluation for alcohol dependence and treatment.   CLINICAL FACTORS:   Alcohol/Substance Abuse/Dependencies   Musculoskeletal: Strength & Muscle Tone: within normal limits Gait & Station: normal Patient leans: N/A  Psychiatric Specialty Exam: Physical Exam  ROS  Blood pressure 115/77, pulse (!) 117, temperature 98.7 F (37.1 C), temperature source Oral, resp. rate 18, height  (1.549 m), weight 49.4 kg (109 lb).Body mass index is 20.6 kg/m.  General Appearance: As in H&P  Eye Contact:  Good  Speech:  Clear and Coherent and Normal Rate  Volume:  Normal  Mood:  Euthymic  Affect:  Appropriate and Full Range  Thought Process:  Goal Directed and Linear  Orientation:  Full (Time, Place, and Person)  Thought Content:  As in H&P  Suicidal Thoughts:  No  Homicidal Thoughts:  No  Memory:  As in H&P  Judgement:  As in H&P  Insight:  As in H&P  Psychomotor Activity:  Normal  Concentration:  As in H&P  Recall:  As in H&P  Fund of Knowledge:  Good  Language:  Good  Akathisia:  Negative  Handed:    AIMS (if indicated):     Assets:  As in H&P  ADL's:  Intact  Cognition:  WNL  Sleep:  Number of Hours: 6      COGNITIVE FEATURES THAT CONTRIBUTE TO RISK:  None    SUICIDE RISK:   Minimal: No identifiable suicidal ideation.  Patients presenting with no risk factors but with morbid ruminations; may be classified as minimal risk based on the severity of  the depressive symptoms  PLAN OF CARE:  As in H&P  I certify that inpatient services furnished can reasonably be expected to improve the patient's condition.   Georgiann Cocker, MD 06/18/2017, 12:15 PM

## 2017-06-18 NOTE — Progress Notes (Signed)
Adult Psychoeducational Group Note  Date:  06/18/2017 Time:  1:30 AM  Group Topic/Focus:  Wrap-Up Group:   The focus of this group is to help patients review their daily goal of treatment and discuss progress on daily workbooks.  Participation Level:  Did Not Attend  Participation Quality:  Did not attend  Affect:  Did not attend  Cognitive:  Did not attend  Insight: None  Engagement in Group:  Did not attend  Modes of Intervention:  Did not attend  Additional Comments:  Pt did not attend the AA group this evening.  Felipa Furnace 06/18/2017, 1:30 AM

## 2017-06-19 MED ORDER — HYDROXYZINE HCL 25 MG PO TABS: 25.0000 mg | ORAL_TABLET | Freq: Three times a day (TID) | ORAL | 0 refills | Status: AC | PRN

## 2017-06-19 MED ORDER — TRAZODONE HCL 50 MG PO TABS: 50.0000 mg | ORAL_TABLET | Freq: Every evening | ORAL | 0 refills | Status: AC | PRN

## 2017-06-19 MED ORDER — NICOTINE POLACRILEX 2 MG MT GUM: 2.0000 mg | CHEWING_GUM | OROMUCOSAL | 0 refills | Status: AC | PRN

## 2017-06-19 NOTE — Tx Team (Signed)
Interdisciplinary Treatment and Diagnostic Plan Update 06/19/2017 Time of Session: 9:30am  [name]  MRN: [id number]  Principal Diagnosis: Substance induced mood disorder (HCC)  Secondary Diagnoses: Principal Problem:   Substance induced mood disorder (HCC)   Current Medications:  Current Facility-Administered Medications  Medication Dose Route Frequency Provider Last Rate Last Dose  . acetaminophen (TYLENOL) tablet 650 mg  650 mg Oral Q6H PRN Nira Conn A, NP      . alum & mag hydroxide-simeth (MAALOX/MYLANTA) 200-200-20 MG/5ML suspension 30 mL  30 mL Oral Q4H PRN Nira Conn A, NP      . hydrOXYzine (ATARAX/VISTARIL) tablet 25 mg  25 mg Oral TID PRN Jackelyn Poling, NP   25 mg at 06/18/17 2144  . ibuprofen (ADVIL,MOTRIN) tablet 400 mg  400 mg Oral PRN Nira Conn A, NP   400 mg at 06/19/17 7829  . LORazepam (ATIVAN) tablet 1 mg  1 mg Oral Q6H PRN Georgiann Cocker, MD   1 mg at 06/19/17 5621  . magnesium hydroxide (MILK OF MAGNESIA) suspension 30 mL  30 mL Oral Daily PRN Nira Conn A, NP      . menthol-cetylpyridinium (CEPACOL) lozenge 3 mg  1 lozenge Oral PRN Nira Conn A, NP   3 mg at 06/18/17 2144  . nicotine polacrilex (NICORETTE) gum 2 mg  2 mg Oral PRN Rankin, Shuvon B, NP   2 mg at 06/18/17 0840  . sodium chloride (OCEAN) 0.65 % nasal spray 1 spray  1 spray Each Nare PRN Jackelyn Poling, NP   1 spray at 06/18/17 2144  . traZODone (DESYREL) tablet 50 mg  50 mg Oral QHS PRN Jackelyn Poling, NP   50 mg at 06/18/17 2144    PTA Medications: Prescriptions Prior to Admission  Medication Sig Dispense Refill Last Dose  . ALPRAZolam (XANAX) 1 MG tablet Take 1 mg by mouth at bedtime as needed for anxiety.     Marland Kitchen amphetamine-dextroamphetamine (ADDERALL) 20 MG tablet Take 20 mg by mouth 4 (four) times daily.     Marland Kitchen ibuprofen (ADVIL,MOTRIN) 200 MG tablet Take 400 mg by mouth as needed. pain   Not Taking at Unknown time  . Pediatric Multivit-Minerals-C (FLINTSTONES GUMMIES PO)  Take 1 tablet by mouth daily.     Not Taking at Unknown time    Treatment Modalities: Medication Management, Group therapy, Case management,  1 to 1 session with clinician, Psychoeducation, Recreational therapy.  Patient Stressors: Financial difficulties Marital or family conflict Patient Strengths: Naval architect for Primary Diagnosis: Substance induced mood disorder (HCC) Long Term Goal(s): Improvement in symptoms so as ready for discharge Short Term Goals: Ability to identify changes in lifestyle to reduce recurrence of condition will improve Ability to verbalize feelings will improve Ability to disclose and discuss suicidal ideas Ability to demonstrate self-control will improve Ability to identify and develop effective coping behaviors will improve Ability to maintain clinical measurements within normal limits will improve Compliance with prescribed medications will improve Ability to identify triggers associated with substance abuse/mental health issues will improve Ability to identify changes in lifestyle to reduce recurrence of condition will improve Ability to verbalize feelings will improve Ability to disclose and discuss suicidal ideas Ability to demonstrate self-control will improve Ability to identify and develop effective coping behaviors will improve Ability to maintain clinical measurements within normal limits will improve Compliance with prescribed medications will improve Ability to identify triggers associated with substance abuse/mental health issues will improve  Medication Management: Evaluate patient's response, side effects, and tolerance of medication regimen.  Therapeutic Interventions: 1 to 1 sessions, Unit Group sessions and Medication administration.  Evaluation of Outcomes: Adequate for Discharge  Physician Treatment Plan for Secondary Diagnosis: Principal Problem:   Substance induced mood disorder (HCC)  Long  Term Goal(s): Improvement in symptoms so as ready for discharge  Short Term Goals: Ability to identify changes in lifestyle to reduce recurrence of condition will improve Ability to verbalize feelings will improve Ability to disclose and discuss suicidal ideas Ability to demonstrate self-control will improve Ability to identify and develop effective coping behaviors will improve Ability to maintain clinical measurements within normal limits will improve Compliance with prescribed medications will improve Ability to identify triggers associated with substance abuse/mental health issues will improve Ability to identify changes in lifestyle to reduce recurrence of condition will improve Ability to verbalize feelings will improve Ability to disclose and discuss suicidal ideas Ability to demonstrate self-control will improve Ability to identify and develop effective coping behaviors will improve Ability to maintain clinical measurements within normal limits will improve Compliance with prescribed medications will improve Ability to identify triggers associated with substance abuse/mental health issues will improve  Medication Management: Evaluate patient's response, side effects, and tolerance of medication regimen.  Therapeutic Interventions: 1 to 1 sessions, Unit Group sessions and Medication administration.  Evaluation of Outcomes: Adequate for Discharge  RN Treatment Plan for Primary Diagnosis: Substance induced mood disorder (HCC) Long Term Goal(s): Knowledge of disease and therapeutic regimen to maintain health will improve  Short Term Goals: Ability to verbalize feelings will improve and Compliance with prescribed medications will improve  Medication Management: RN will administer medications as ordered by provider, will assess and evaluate patient's response and provide education to patient for prescribed medication. RN will report any adverse and/or side effects to prescribing  provider.  Therapeutic Interventions: 1 on 1 counseling sessions, Psychoeducation, Medication administration, Evaluate responses to treatment, Monitor vital signs and CBGs as ordered, Perform/monitor CIWA, COWS, AIMS and Fall Risk screenings as ordered, Perform wound care treatments as ordered.  Evaluation of Outcomes: Adequate for Discharge  LCSW Treatment Plan for Primary Diagnosis: Substance induced mood disorder (HCC) Long Term Goal(s): Safe transition to appropriate next level of care at discharge, Engage patient in therapeutic group addressing interpersonal concerns. Short Term Goals: Engage patient in aftercare planning with referrals and resources, Facilitate patient progression through stages of change regarding substance use diagnoses and concerns, Identify triggers associated with mental health/substance abuse issues and Increase skills for wellness and recovery  Therapeutic Interventions: Assess for all discharge needs, 1 to 1 time with Social worker, Explore available resources and support systems, Assess for adequacy in community support network, Educate family and significant other(s) on suicide prevention, Complete Psychosocial Assessment, Interpersonal group therapy.  Evaluation of Outcomes: Adequate for Discharge  Progress in Treatment: Attending groups: Yes Participating in groups: Yes Taking medication as prescribed: Yes, MD continues to assess for medication changes as needed Toleration medication: Yes, no side effects reported at this time Family/Significant other contact made: Yes, pt's husband contacted. Patient understands diagnosis: Developing insight  Discussing patient identified problems/goals with staff: Yes Medical problems stabilized or resolved: Yes Denies suicidal/homicidal ideation: Yes Issues/concerns per patient self-inventory: None Other: N/A  New problem(s) identified: None identified at this time.   New Short Term/Long Term Goal(s): None identified  at this time.   Discharge Plan or Barriers: Pt will return home and follow up with an outpatient provider.  Reason for  Continuation of Hospitalization:  None identified at this time   Estimated Length of Stay: 0 days; Pt will likely discharge today 06/19/17  Attendees: Patient: 06/19/2017 11:58 AM  Physician: Dr. Jama Flavors 06/19/2017 11:58 AM  Nursing: Rodell Perna RN; Jesusita Oka, RN 06/19/2017 11:58 AM  RN Care Manager: Onnie Boer, RN 06/19/2017 11:58 AM  Social Worker: Donnelly Stager, LCSWA 06/19/2017 11:58 AM  Recreational Therapist:  06/19/2017 11:58 AM  Other: Armandina Stammer, NP 06/19/2017 11:58 AM  Other:  06/19/2017 11:58 AM  Other: 06/19/2017 11:58 AM  Scribe for Treatment Team: Jonathon Jordan, MSW,LCSWA 06/19/2017 11:58 AM

## 2017-06-19 NOTE — [facility] Suicide Risk Assessment (Signed)
[facility] Discharge Suicide Risk Assessment   Principal Problem: Substance induced mood disorder [facility]) Discharge Diagnoses:  Patient Active Problem List   Diagnosis Date Noted  . Substance induced mood disorder (HCC) [F19.94] 06/17/2017    Total Time spent with patient: 30 minutes  Musculoskeletal: Strength & Muscle Tone: within normal limits Gait & Station: normal Patient leans: N/A  Psychiatric Specialty Exam: Review of Systems  Constitutional: Negative.   HENT: Negative.   Eyes: Negative.   Respiratory: Negative.   Cardiovascular: Negative.   Gastrointestinal: Negative.   Genitourinary: Negative.   Musculoskeletal: Negative.   Skin: Negative.   Neurological: Negative.   Endo/Heme/Allergies: Negative.   Psychiatric/Behavioral: Negative for depression, hallucinations, memory loss and suicidal ideas. The patient is not nervous/anxious and does not have insomnia.     Blood pressure 112/78, pulse (!) 106, temperature 99.2 F (37.3 C), temperature source Oral, resp. rate 16, height  (1.549 m), weight 49.4 kg (109 lb).Body mass index is 20.6 kg/m.  General Appearance: Neatly dressed, pleasant, engaging well and cooperative. Appropriate behavior. Not in any distress. Good relatedness. Not internally stimulated  Eye Contact::  Good  Speech:  Spontaneous, normal prosody. Normal tone and rate.   Volume:  Normal  Mood:  Euthymic  Affect:  Appropriate and Full Range  Thought Process:  Goal Directed  Orientation:  Full (Time, Place, and Person)  Thought Content:  Future oriented. No delusional theme. No preoccupation with violent thoughts. No negative ruminations. No obsession.  No hallucination in any modality.   Suicidal Thoughts:  No  Homicidal Thoughts:  No  Memory:  Immediate;   Good Recent;   Good Remote;   Good  Judgement:  Good  Insight:  Good  Psychomotor Activity:  Normal  Concentration:  Good  Recall:  Good  Fund of Knowledge:Good  Language: Good  Akathisia:   Negative  Handed:    AIMS (if indicated):     Assets:  Communication Skills Desire for Improvement Financial Resources/Insurance Housing Intimacy Physical Health Resilience Social Support Talents/Skills Transportation Vocational/Educational  Sleep:  Number of Hours: 6.5  Cognition: WNL  ADL's:  Intact   Clinical Assessment::   41 y.o Caucasian female, married, lives with her family, 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 Presented to the ER via the police. Family called and expressed concerns that patient was threatening to cut self with a razor. She was reported to have gotten into a rage following an argument with her husband and her best friend. Reported to have expressed anger towards her husband for an affair he had a year ago. Reported to have expressed concerns about losing her job. UDS was positive for amphetamines, benzodiazepines and THC. BAL was 94 mg/dl. Other parameters are WNL. Her husband and son has been very supportive since she has been in hospital and wants her home soon.  Seen today. Says her family has been visiting daily. Says her family has been looking forward to discharge. No new strain in the family dynamics. No new stressors. She hopes she can get back to work soon. Reports thats he is in good spirits. She does not feeling depressed. Reports normal energy and interest. Reports normal sleep and appetite. A bit under the weather as she developed URI recently. Says she is able to think clearly. She is able to focus on task. Her thoughts are not crowded or racing. No evidence of mania. No hallucination in any modality. He is not making any delusional statement. No passivity of will/thought. He is fully in touch  with reality. No thoughts of suicide. No thoughts of homicide. No violent thoughts. No overwhelming anxiety. No access to weapons.   SW spoke with her husband today. He does not have any concerns about patient's safety. Wants her home today. Collaborated patient's account.    Nursing staff reports that patient has been appropriate on the unit. Patient has been interacting well with peers. No behavioral issues. Patient has not voiced any suicidal thoughts. Patient has not been observed to be internally stimulated. Patient has been adherent with treatment recommendations. Patient has been tolerating their medication well.   Patient was discussed at team. Team members feels that patient is back to her baseline level of function. Team agrees with plan to discharge patient today.   Demographic Factors:  NA  Loss Factors: NA  Historical Factors: NA  Risk Reduction Factors:   Sense of responsibility to family, Religious beliefs about death, Employed, Living with another person, especially a relative, Positive social support, Positive therapeutic relationship and Positive coping skills or problem solving skills  Continued Clinical Symptoms:   as above   Cognitive Features That Contribute To Risk:  None    Suicide Risk:  Minimal: No identifiable suicidal ideation.  Patient is not having any thoughts of suicide at this time. Modifiable risk factors targeted during this admission includes mood disorder and substance use. Demographical and historical risk factors cannot be modified. Patient is now engaging well. Patient is reliable and is future oriented. We have buffered patient's support structures. At this point, patient is at low risk of suicide. Patient is aware of the effects of psychoactive substances on decision making process. Patient has been provided with emergency contacts. Patient acknowledges to use resources provided if unforseen circumstances changes their current risk stratification.    Follow-up Information    Milagros Evener, MD Follow up on 06/20/2017.   Specialty:  Psychiatry Why:  Medication management appointment 9/18 :45pm with Dr. Evelene Croon. Please call the office if you need to cancel or reshceudle your appointment. Contact information: [address]Tyson Bab[address] [place] [address] [phone]        [facility] Follow up.   Specialty:  Professional Counselor Why:  Please go for a walk-in appointment to be established for outpatient therapy services. Walk-in hours are Mon-Fri 8:30am-12pm and 1:00pm-2:30pm. Please arrive as early as possible to be sure that you are seen. Contact information: [facility] [address] [place] [address] [phone]           Plan Of Care/Follow-up recommendations:  1. Continue current psychotropic medications 2. Mental health and addiction follow up as arranged.  3. Discharge in care of her family    Georgiann Cocker, MD 06/19/2017, 12:05 PM

## 2017-06-19 NOTE — Progress Notes (Signed)
Recreation Therapy Notes  Date: 06/19/17 Time: 0930 Location: 300 Dayroom  Group Topic: Stress Management  Goal Area(s) Addresses:  Patient will verbalize importance of using healthy stress management.  Patient will identify positive emotions associated with healthy stress management.   Intervention: Stress Management  Activity :  Meditation.  LRT introduced the stress management technique of meditation.  Patients were to listen and follow along as a meditation played from the Calm app.    Education:  Stress Management, Discharge Planning.   Education Outcome: Acknowledges edcuation/In group clarification offered/Needs additional education  Clinical Observations/Feedback: Pt did not attend group.   Caroll Rancher, LRT/CTRS         Caroll Rancher A 06/19/2017 12:10 PM

## 2017-06-19 NOTE — [facility] Suicide Risk Assessment (Signed)
[facility] INPATIENT:  Family/Significant Other Suicide Prevention Education  Suicide Prevention Education:  Education Completed; [name] (husband [phone]) , has been identified by the patient as the family member/significant other with whom the patient will be residing, and identified as the person(s) who will aid the patient in the event of a mental health crisis (suicidal ideations/suicide attempt).  With written consent from the patient, the family member/significant other has been provided the following suicide prevention education, prior to the and/or following the discharge of the patient.  The suicide prevention education provided includes the following:  Suicide risk factors  Suicide prevention and interventions  National Suicide Hotline telephone number  [facility] assessment telephone number  [facility] Emergency Assistance 911  [facility] and/or Residential Mobile Crisis Unit telephone number  Request made of family/significant other to:  Remove weapons (e.g., guns, rifles, knives), all items previously/currently identified as safety concern.    Remove drugs/medications (over-the-counter, prescriptions, illicit drugs), all items previously/currently identified as a safety concern.  The family member/significant other verbalizes understanding of the suicide prevention education information provided.  The family member/significant other agrees to remove the items of safety concern listed above.  Pt's husband states that he has no concerns about pt discharging home today. Pt's husband states that the guns have been removed from the home and they are now at his parents' home. Pt's husband will be the one to pick pt up from the hospital today and will also be sure that pt makes it to her outpatient follow-up appointments.  Jonathon Jordan, MSW, LCSWA  06/19/2017, 11:53 AM

## 2017-06-19 NOTE — Progress Notes (Addendum)
Nursing Discharge Note 06/19/2017 [id number]  Data Reports sleeping fair with PRN sleep med.  Rates depression 0/10, hopelessness 0/10, and anxiety 0/10. Affect anxious, appropriate.  Mood euthymic.  Denies HI, SI, AVH.  Remains in room, c/o ongoing nasal congestion "I don't want to get anyone sick." Received discharge orders.  Action Spoke with patient 1:1, nurse offered support to patient throughout shift..  Reviewed medications, discharge instructions, and follow up appointments with patient. Medication samples and scripts reviewed and given to patient.  Paperwork, AVS, SRA, and transition record handed to patient.   Escorted off of unit at 1315. Belongings returned per belongings form.  Discharged to lobby where she was met by S/O.    Response Verbalized understanding of discharge teaching. Agrees to contact someone or 911 with thoughts/intent to harm self or others.    To follow up per AVS.
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
## 2017-06-19 NOTE — Progress Notes (Signed)
  [facility] Adult Case Management Discharge Plan :  Will you be returning to the same living situation after discharge:  Yes,  pt returning home. At discharge, do you have transportation home?: Yes,  pt's husband will transport. Do you have the ability to pay for your medications: Yes,  prescriptions and samples provided.  Release of information consent forms completed and in the chart;  Patient's signature needed at discharge.  Patient to Follow up at: Follow-up Information    Milagros Evener, MD Follow up on 06/20/2017.   Specialty:  Psychiatry Why:  Medication management appointment 9/18 :45pm with Dr. Evelene Croon. Please call the office if you need to cancel or reshceudle your appointment. Contact information: [address] [place] [address] [phone]        [facility] Follow up.   Specialty:  Professional Counselor Why:  Please go for a walk-in appointment to be established for outpatient therapy services. Walk-in hours are Mon-Fri 8:30am-12pm and 1:00pm-2:30pm. Please arrive as early as possible to be sure that you are seen. Contact information: [facility] [address] [place] [address] [phone]           Next level of care provider has access to [facility] Link:no  Safety Planning and Suicide Prevention discussed: Yes,  with pt and with pt's husband.  Have you used any form of tobacco in the last 30 days? (Cigarettes, Smokeless Tobacco, Cigars, and/or Pipes): Yes  Has patient been referred to the Quitline?: Patient refused referral  Patient has been referred for addiction treatment: Yes  Jonathon Jordan, MSW, LCSWA 06/19/2017, 11:56 AM
